# Patient Record
Sex: Female | Born: 1955 | Race: White | Hispanic: No | Marital: Married | Smoking: Never smoker
Health system: Southern US, Academic
[De-identification: ages and names within clinical notes are randomized; demographics above are authoritative.]

## PROBLEM LIST (undated history)

## (undated) DIAGNOSIS — K219 Gastro-esophageal reflux disease without esophagitis: Secondary | ICD-10-CM

## (undated) DIAGNOSIS — N200 Calculus of kidney: Secondary | ICD-10-CM

## (undated) DIAGNOSIS — E785 Hyperlipidemia, unspecified: Secondary | ICD-10-CM

## (undated) DIAGNOSIS — I10 Essential (primary) hypertension: Secondary | ICD-10-CM

## (undated) DIAGNOSIS — N3010 Interstitial cystitis (chronic) without hematuria: Secondary | ICD-10-CM

## (undated) HISTORY — PX: HX TONSILLECTOMY: SHX27

## (undated) HISTORY — PX: HX ADENOIDECTOMY: SHX29

## (undated) HISTORY — DX: Gastro-esophageal reflux disease without esophagitis: K21.9

## (undated) HISTORY — DX: Essential (primary) hypertension: I10

## (undated) HISTORY — PX: HX APPENDECTOMY: SHX54

## (undated) HISTORY — DX: Hyperlipidemia, unspecified: E78.5

## (undated) HISTORY — PX: HX GALL BLADDER SURGERY/CHOLE: SHX55

## (undated) HISTORY — PX: HX HYSTERECTOMY: SHX81

---

## 1987-07-28 ENCOUNTER — Other Ambulatory Visit (HOSPITAL_COMMUNITY): Payer: Self-pay | Admitting: OBSTETRICS/GYNECOLOGY

## 2019-06-14 IMAGING — MG 3D SCREENING MAMMO BIL W/CAD
4 series · 8 of 14 positions shown · non-contrast
Comparison: 11/01/2019 and 09/30/2018.

------------- REPORT [id number] -------------
[facility]
[address]
Daina Ms.DEP, PIERRE EDOUARD:
We wish to report the following on your recent mammography examination. We are sending a report to your referring physician or other health care provider. 
(       Normal/Negative:
No evidence of cancer.
This statement is mandated by the [place], Department of Health.
Your examination was performed by one of our technologists, who are registered radiological technologists and also specially certified in mammography:
___
Parlak, Edaly (M)
Nepomuceno, Martinez (M)

Your mammogram was interpreted by our radiologist.
( 
Sofeine Made, M.D.
(Annual Breast Examination by a physician or other health care provider
(Annual Mammography Screening beginning at age 40
(Monthly Breast Self Examination
------------- REPORT [id number] -------------
OSLAJ, MITCH MABLE
RAJCOMAR, SHIBCHURN
EXAM:  3D BILATERAL ANNUAL SCREENING DIGITAL MAMMOGRAM WITH CAD AND TOMOSYNTHESIS
INDICATION: Screening.

[Series 1943: R CC · right · 2 of 2 slices shown]
[im 1/2]
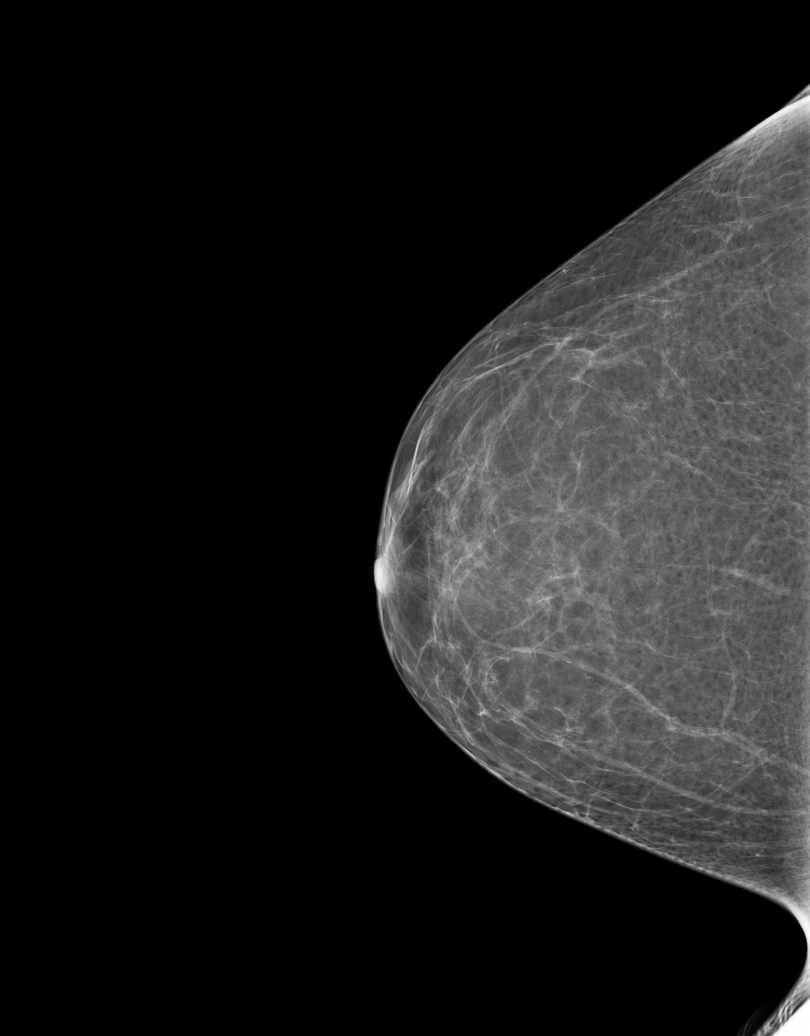
[im 2/2]
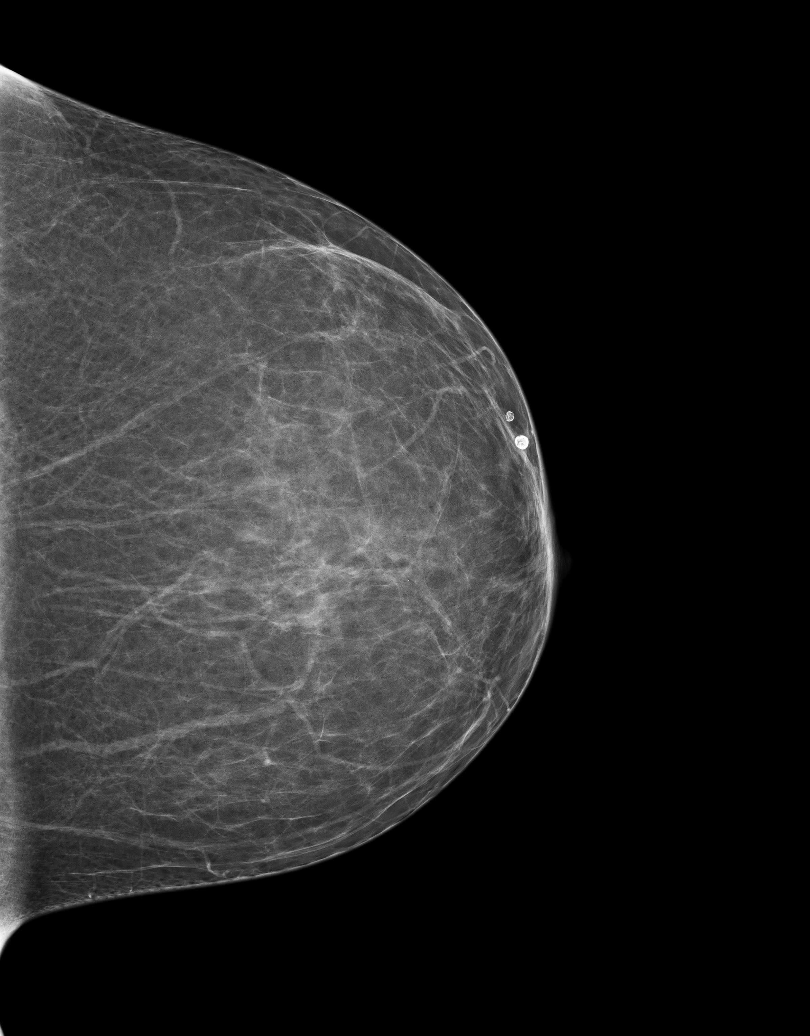

[Series 1945: 3D SCREENING MAMMO BIL W/CAD · 2 acquisitions, 4 frames shown]
[im 1/2]
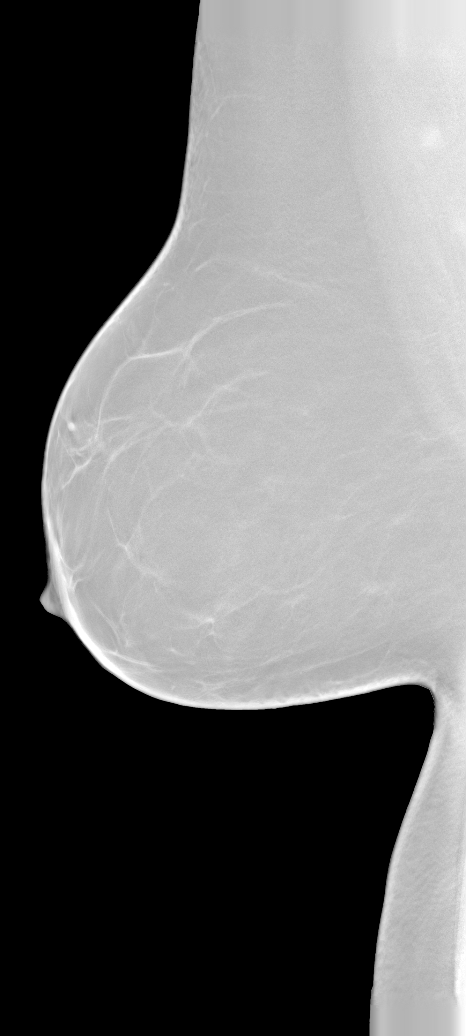
[im 1/2]
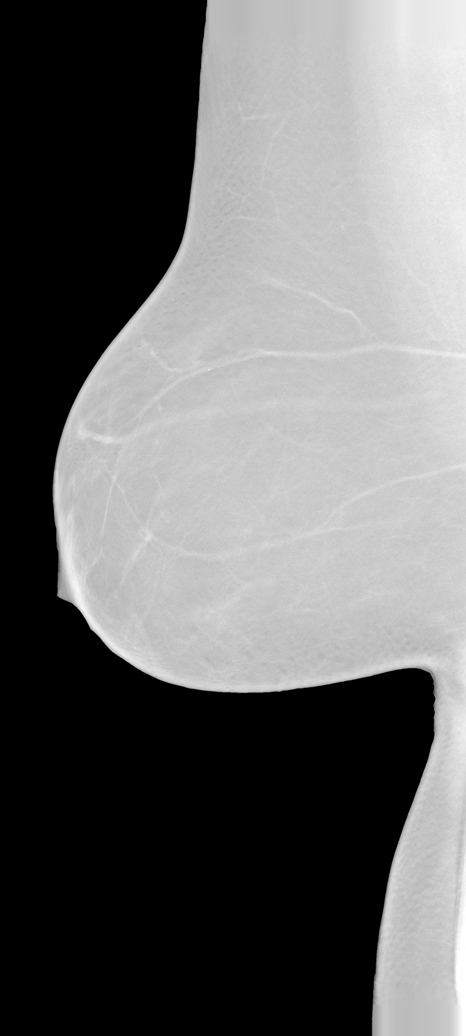
[im 2/2]
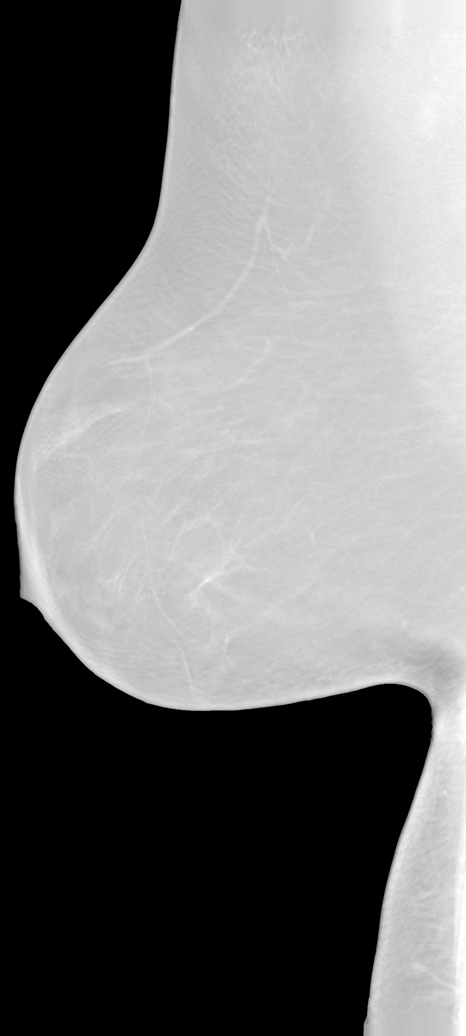
[im 2/2]
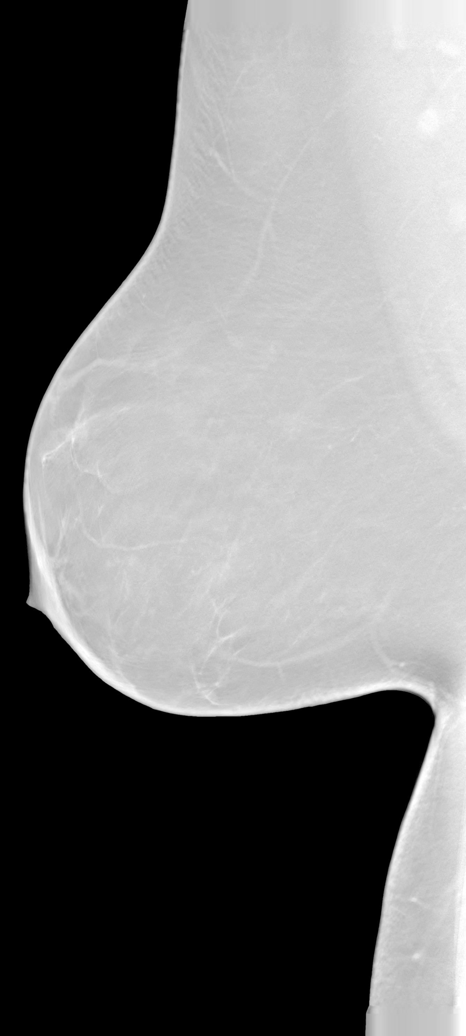

[R]
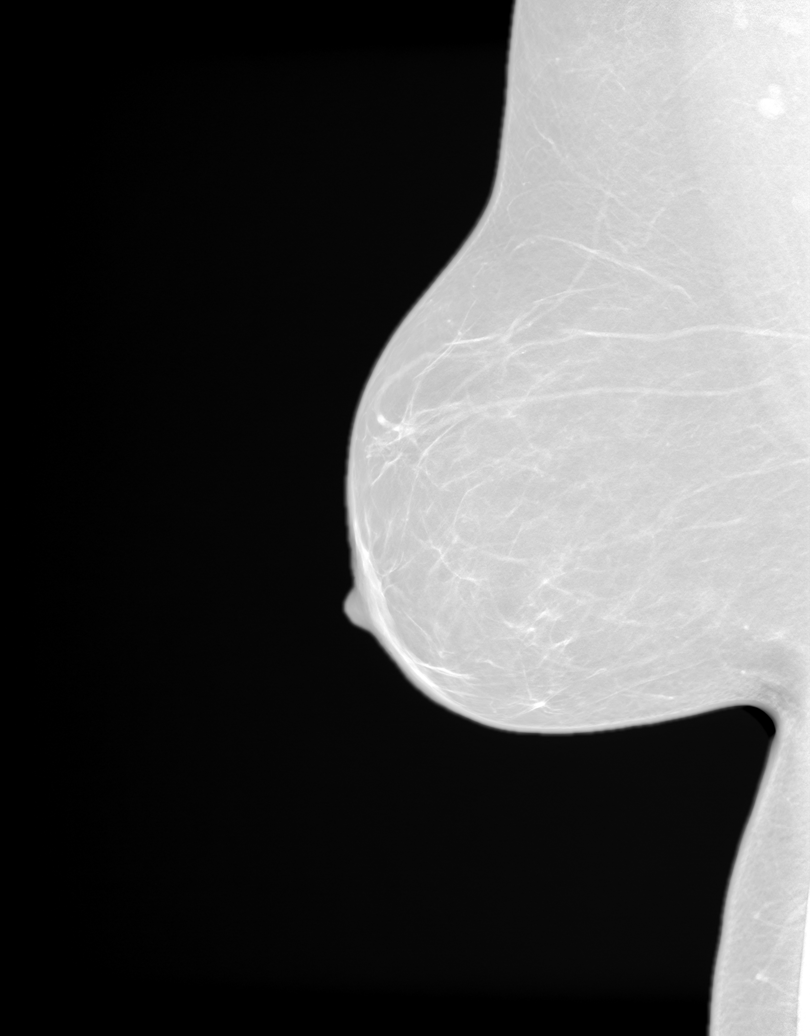

[L]
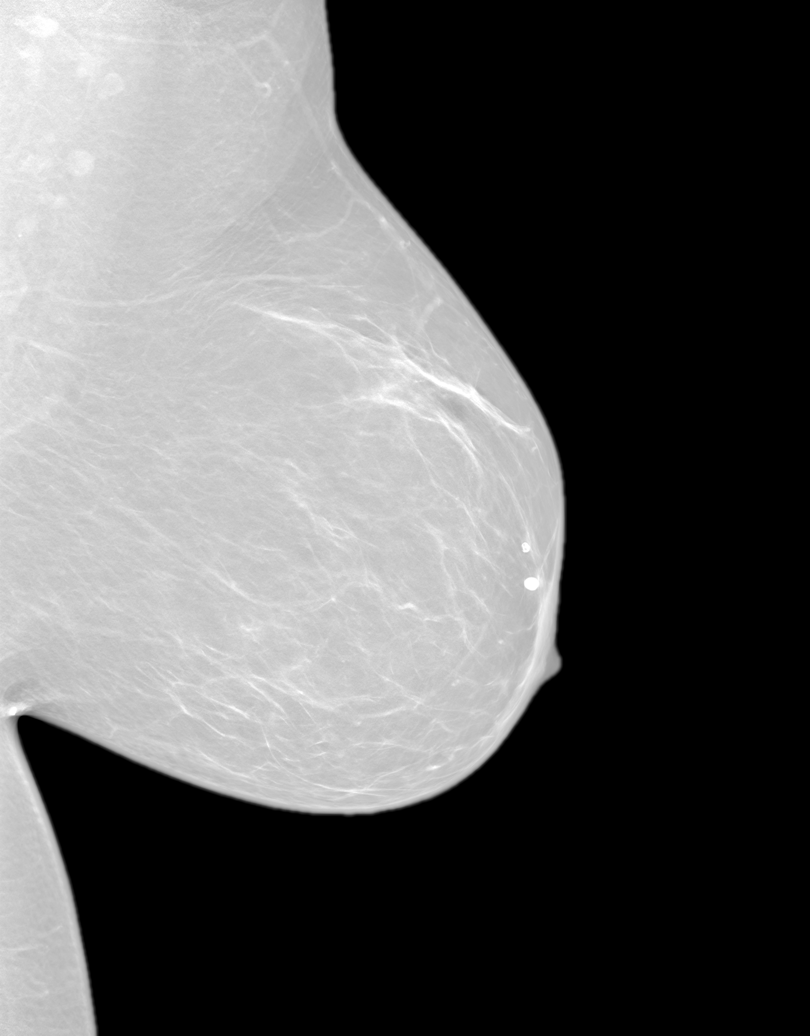

[8 of 14 positions shown; findings below may reference images not displayed]

FINDINGS: There are scattered fibroglandular elements.  There is no mass or suspicious cluster of microcalcifications.   There is no architectural distortion, skin thickening or nipple retraction.
IMPRESSION: 1.  BIRADS 2-Benign findings. Patient has been added in a reminder system with a target date for the next screening mammography.

2.  DENSITY CODE – B (Scattered areas of fibroglandular density.) 

Final Assessment Code:

Bi-Rads 2 

BI-RADS 0
Need additional imaging evaluation.

BI-RADS 1
Negative mammogram.

BI-RADS 2
Benign finding.

BI-RADS 3
Probably benign finding; short-interval follow-up suggested.

BI-RADS 4
Suspicious abnormality; biopsy should be considered.

BI-RADS 5
Highly suggestive of malignancy; appropriate action should be taken.

BI-RADS 6
Known biopsy-proven malignancy; appropriate action should be taken. 

NOTE:
In compliance with Federal regulations, the results of this mammogram are being sent to the patient.

## 2021-08-01 ENCOUNTER — Other Ambulatory Visit (HOSPITAL_COMMUNITY): Payer: Self-pay | Admitting: Family

## 2021-08-01 ENCOUNTER — Inpatient Hospital Stay
Admission: RE | Admit: 2021-08-01 | Discharge: 2021-08-01 | Disposition: A | Discharge status: Home / Self Care | Payer: 59 | Source: Ambulatory Visit | Attending: Family | Admitting: Family

## 2021-08-01 ENCOUNTER — Other Ambulatory Visit: Payer: Self-pay

## 2021-08-01 DIAGNOSIS — R059 Cough, unspecified: Secondary | ICD-10-CM | POA: Insufficient documentation

## 2021-08-16 ENCOUNTER — Other Ambulatory Visit: Payer: Self-pay

## 2021-11-16 ENCOUNTER — Other Ambulatory Visit: Payer: Self-pay

## 2021-11-16 IMAGING — CT CT BRAIN W/O CONTRAST
2 series · 16 of 30 positions shown, 20 images · non-contrast
Comparison: None available.

﻿EXAM:  [id number]   CT BRAIN W/O CONTRAST
INDICATION: Dizziness. Headache.
TECHNIQUE: CT head was performed without contrast and reviewed in multiple windows. Radiation dose 9811 mGy cm. Exam was performed using one or more of the following dose reduction techniques: Automated exposure control, adjustment of the mA and/or kV according to patient size, or the use of iterative reconstruction technique.

[Series 2: axial without · axial · non-contrast · 0.51mm/px · z∈[+131,+275]mm · 13 of 112 slices shown, 17 images]
[im 8/112  brain]
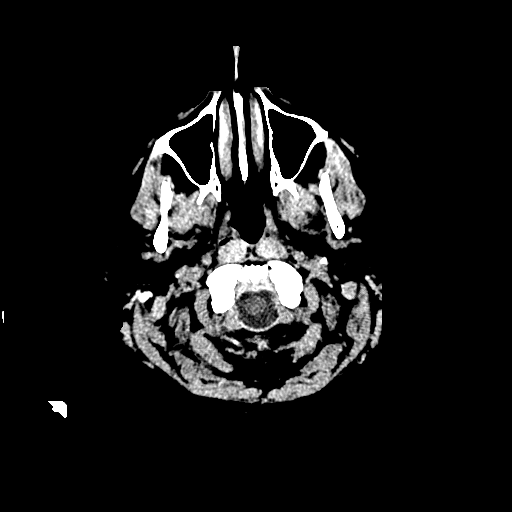
[im 8/112  bone]
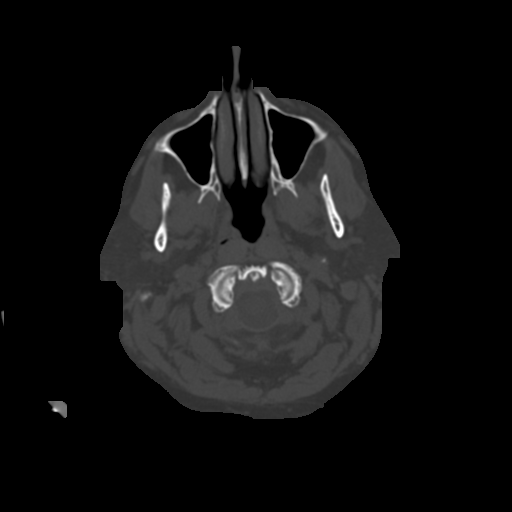
[im 16/112  brain]
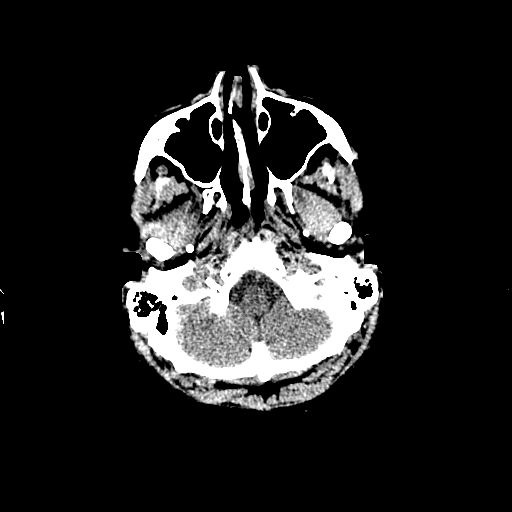
[im 24/112  brain]
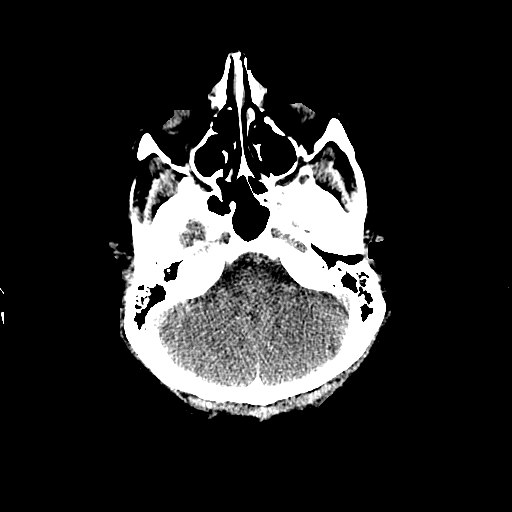
[im 32/112  brain]
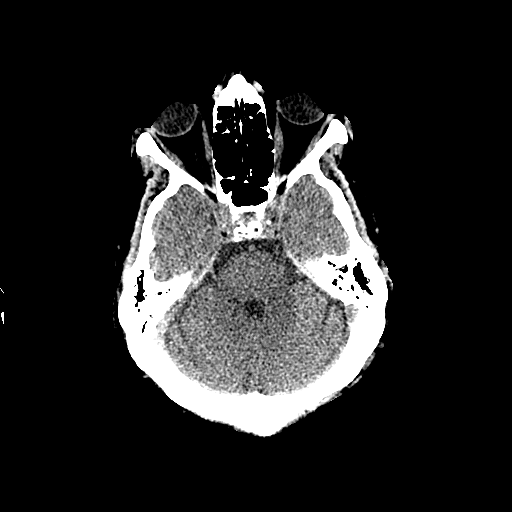
[im 40/112  brain]
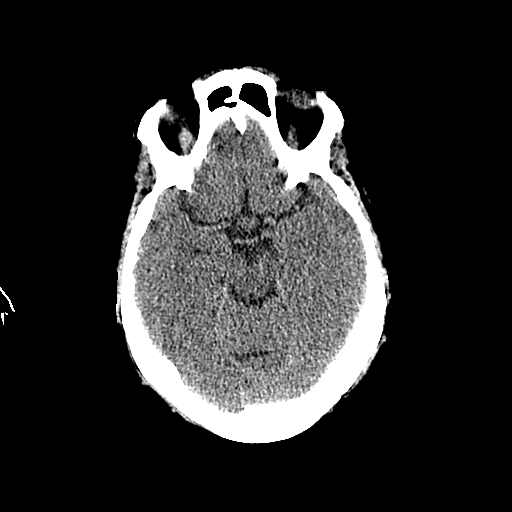
[im 40/112  bone]
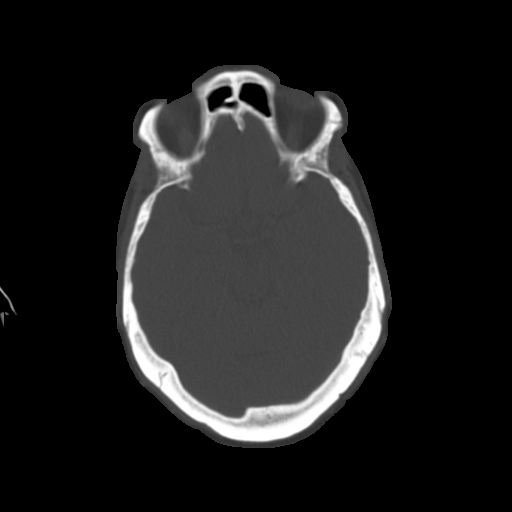
[im 48/112  brain]
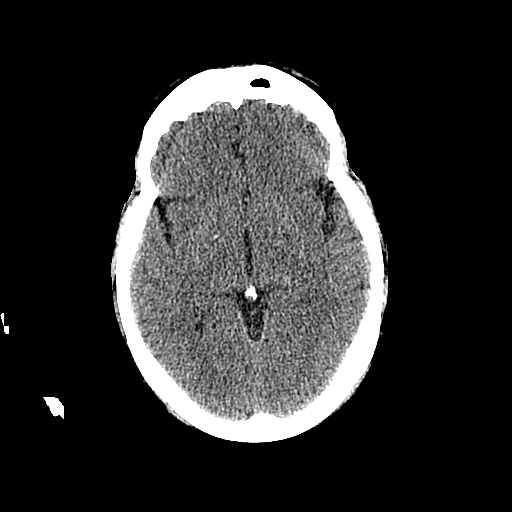
[im 56/112  brain]
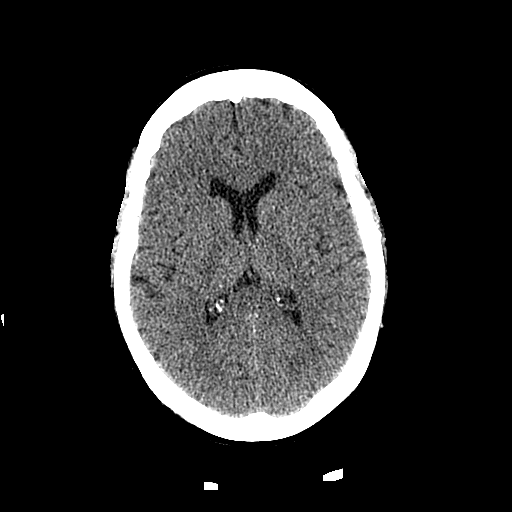
[im 64/112  brain]
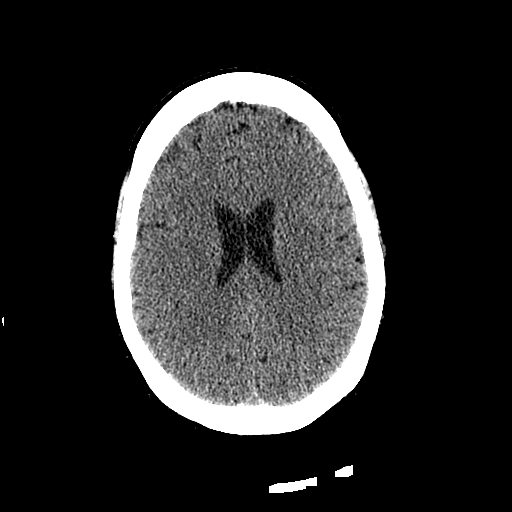
[im 72/112  brain]
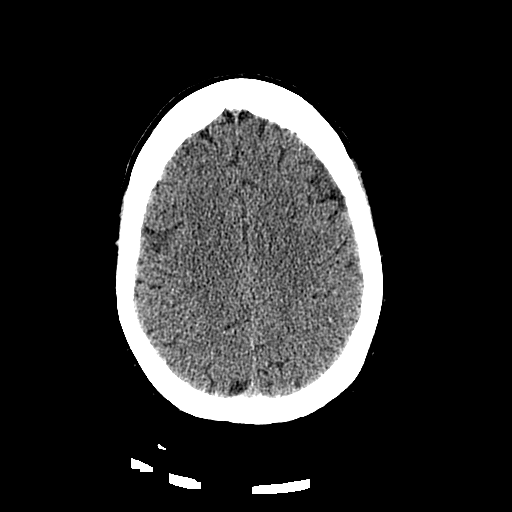
[im 72/112  bone]
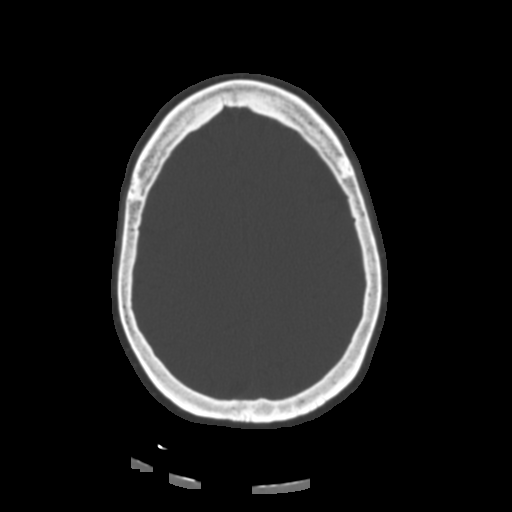
[im 80/112  brain]
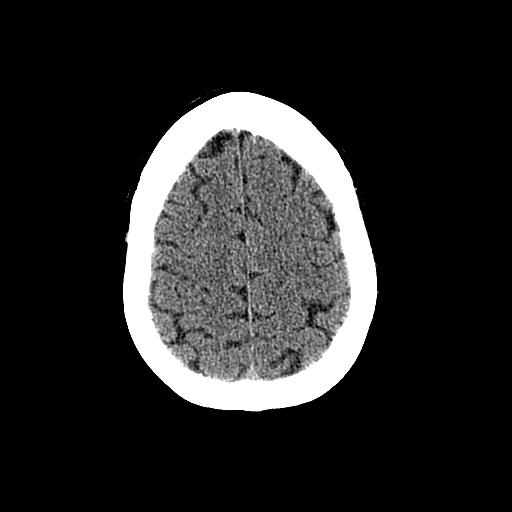
[im 88/112  brain]
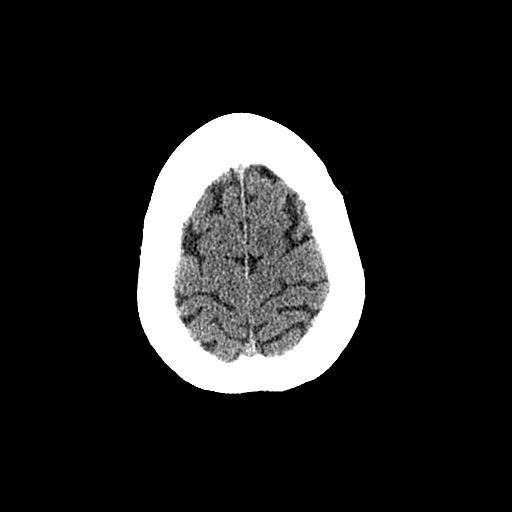
[im 96/112  brain]
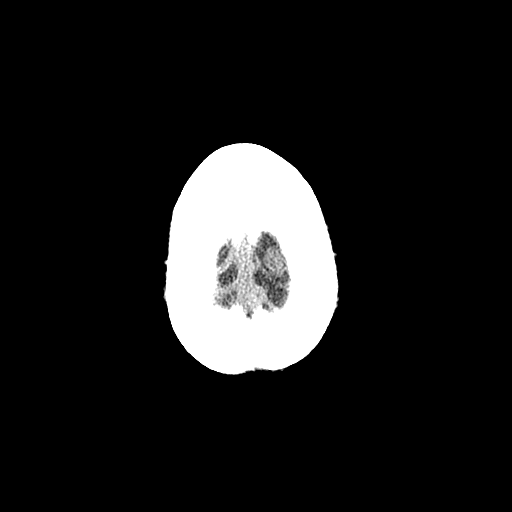
[im 104/112  brain]
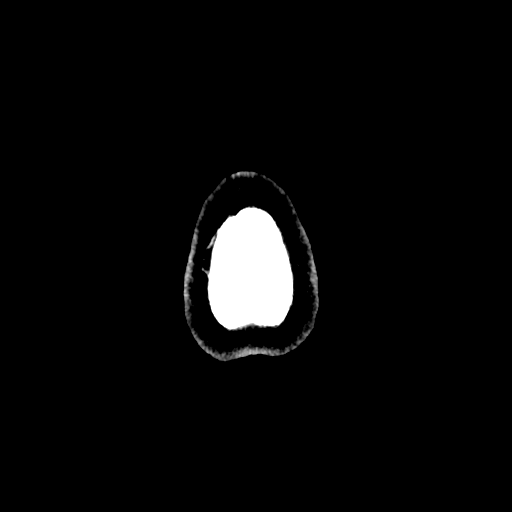
[im 104/112  bone]
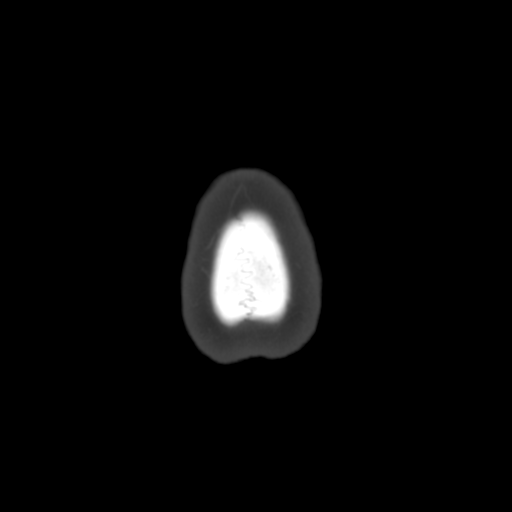

[Series 3: ax bone (hospital) · axial · 0.51mm/px · z∈[+131,+179]mm · 3 of 112 slices shown]
[im 8/112  bone]
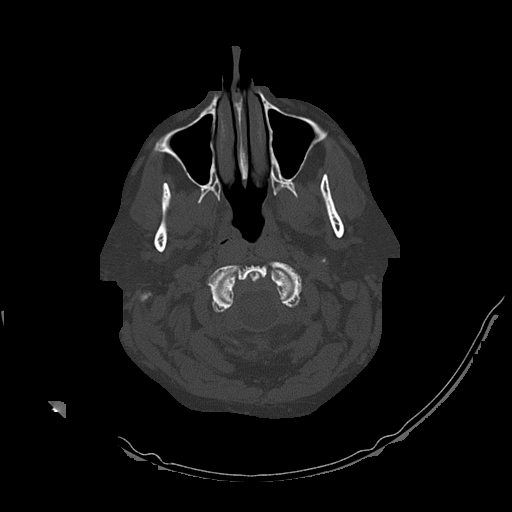
[im 24/112  bone]
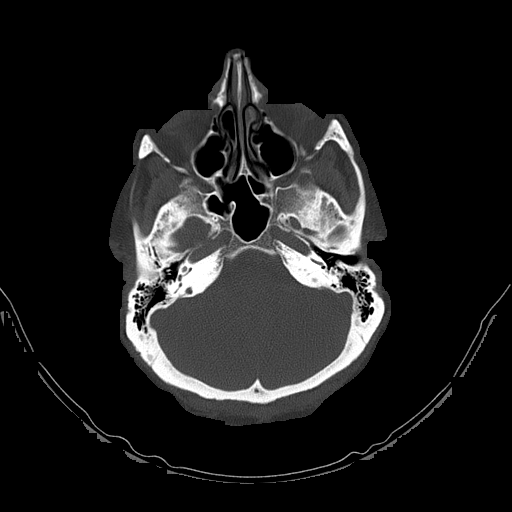
[im 40/112  bone]
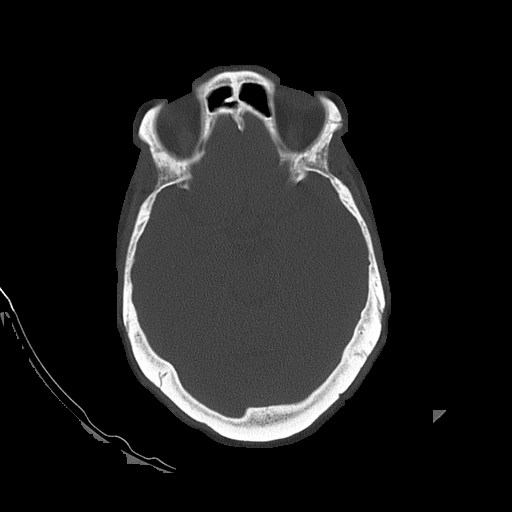

[16 of 30 positions shown; findings below may reference images not displayed]

FINDINGS: No intracranial bleed or extra-axial collections are seen.  No evidence of ventriculomegaly or midline shift is noted.  No abnormalities of the posterior fossa structures are seen in this noncontrast examination. 

Paranasal sinuses and mastoids do not show any acute findings.
IMPRESSION: Limited noncontrast CT head shows no focal or acute intracranial lesions.  Paranasal sinuses and mastoids are unremarkable.

## 2022-09-21 ENCOUNTER — Emergency Department (HOSPITAL_COMMUNITY): Payer: MEDICARE

## 2022-09-21 ENCOUNTER — Encounter (HOSPITAL_COMMUNITY): Payer: Self-pay

## 2022-09-21 ENCOUNTER — Other Ambulatory Visit: Payer: Self-pay

## 2022-09-21 ENCOUNTER — Emergency Department
Admission: EM | Admit: 2022-09-21 | Discharge: 2022-09-21 | Disposition: A | Discharge status: Home / Self Care | Payer: MEDICARE | Attending: Family | Admitting: Family

## 2022-09-21 DIAGNOSIS — K760 Fatty (change of) liver, not elsewhere classified: Secondary | ICD-10-CM | POA: Insufficient documentation

## 2022-09-21 DIAGNOSIS — N3011 Interstitial cystitis (chronic) with hematuria: Secondary | ICD-10-CM | POA: Insufficient documentation

## 2022-09-21 DIAGNOSIS — N200 Calculus of kidney: Secondary | ICD-10-CM | POA: Insufficient documentation

## 2022-09-21 LAB — URINALYSIS, MICROSCOPIC
RBCS: 10 /hpf — ABNORMAL HIGH (ref <4)
RENAL EPITHELIAL CELLS URINE: <1 /hpf (ref <6)
SQUAMOUS EPITHELIAL: <1 /hpf (ref <28)
WBCS: 5 /hpf (ref <6)

## 2022-09-21 LAB — COMPREHENSIVE METABOLIC PANEL, NON-FASTING
ALBUMIN/GLOBULIN RATIO: 1.4 (ref 0.8–1.4)
ALBUMIN: 4.3 g/dL (ref 3.5–5.7)
ALKALINE PHOSPHATASE: 53 U/L (ref 34–104)
ALT (SGPT): 39 U/L (ref 7–52)
ANION GAP: 10 mmol/L (ref 4–13)
AST (SGOT): 37 U/L (ref 13–39)
BILIRUBIN TOTAL: 0.4 mg/dL (ref 0.3–1.2)
BUN/CREA RATIO: 26 — ABNORMAL HIGH (ref 6–22)
BUN: 18 mg/dL (ref 7–25)
CALCIUM, CORRECTED: 9 mg/dL (ref 8.9–10.8)
CALCIUM: 9.2 mg/dL (ref 8.6–10.3)
CHLORIDE: 105 mmol/L (ref 98–107)
CO2 TOTAL: 25 mmol/L (ref 21–31)
CREATININE: 0.7 mg/dL (ref 0.60–1.30)
ESTIMATED GFR: 96 mL/min/{1.73_m2} (ref >59)
GLOBULIN: 3.1 (ref 2.9–5.4)
GLUCOSE: 128 mg/dL — ABNORMAL HIGH (ref 74–109)
OSMOLALITY, CALCULATED: 283 mOsm/kg (ref 270–290)
POTASSIUM: 3.4 mmol/L — ABNORMAL LOW (ref 3.5–5.1)
PROTEIN TOTAL: 7.4 g/dL (ref 6.4–8.9)
SODIUM: 140 mmol/L (ref 136–145)

## 2022-09-21 LAB — BLUE TOP TUBE

## 2022-09-21 LAB — CBC WITH DIFF
BASOPHIL #: 0.1 10*3/uL (ref 0.00–0.10)
BASOPHIL %: 1 % (ref 0–1)
EOSINOPHIL #: 0.4 10*3/uL (ref 0.00–0.50)
EOSINOPHIL %: 4 %
HCT: 41.2 % (ref 31.2–41.9)
HGB: 14 g/dL (ref 10.9–14.3)
LYMPHOCYTE #: 3.6 10*3/uL — ABNORMAL HIGH (ref 1.00–3.00)
LYMPHOCYTE %: 40 % (ref 16–44)
MCH: 30.5 pg (ref 24.7–32.8)
MCHC: 33.9 g/dL (ref 32.3–35.6)
MCV: 89.9 fL (ref 75.5–95.3)
MONOCYTE #: 0.8 10*3/uL (ref 0.30–1.00)
MONOCYTE %: 9 % (ref 5–13)
MPV: 8.8 fL (ref 7.9–10.8)
NEUTROPHIL #: 4.2 10*3/uL (ref 1.85–7.80)
NEUTROPHIL %: 46 % (ref 43–77)
PLATELETS: 273 10*3/uL (ref 140–440)
RBC: 4.58 10*6/uL (ref 3.63–4.92)
RDW: 13.5 % (ref 12.3–17.7)
WBC: 9 10*3/uL (ref 3.8–11.8)

## 2022-09-21 LAB — LIPASE: LIPASE: 27 U/L (ref 11–82)

## 2022-09-21 LAB — URINALYSIS, MACROSCOPIC
BILIRUBIN: NEGATIVE mg/dL
BLOOD: 0.06 mg/dL — AB
GLUCOSE: NEGATIVE mg/dL
KETONES: NEGATIVE mg/dL
LEUKOCYTES: NEGATIVE WBCs/uL
NITRITE: NEGATIVE
PH: 5.5 (ref 5.0–9.0)
PROTEIN: 20 mg/dL
SPECIFIC GRAVITY: 1.028 (ref 1.002–1.030)
UROBILINOGEN: NORMAL mg/dL

## 2022-09-21 LAB — GOLD TOP TUBE

## 2022-09-21 LAB — LACTIC ACID LEVEL W/ REFLEX FOR LEVEL >2.0: LACTIC ACID: 2 mmol/L (ref 0.5–2.2)

## 2022-09-21 MED ORDER — MORPHINE 4 MG/ML INJECTION WRAPPER
INJECTION | INTRAMUSCULAR | Status: AC
Start: 2022-09-21 — End: 2022-09-21
  Filled 2022-09-21: qty 1

## 2022-09-21 MED ORDER — HYDROCODONE 5 MG-ACETAMINOPHEN 325 MG TABLET
1.0000 | ORAL_TABLET | Freq: Four times a day (QID) | ORAL | 0 refills | Status: DC | PRN
Start: 2022-09-21 — End: 2022-12-21

## 2022-09-21 MED ORDER — ONDANSETRON HCL (PF) 4 MG/2 ML INJECTION SOLUTION
INTRAMUSCULAR | Status: AC
Start: 2022-09-21 — End: 2022-09-21
  Filled 2022-09-21: qty 2

## 2022-09-21 MED ORDER — SODIUM CHLORIDE 0.9 % IV BOLUS
1000.0000 mL | INJECTION | Status: AC
Start: 2022-09-21 — End: 2022-09-21
  Administered 2022-09-21: 1000 mL via INTRAVENOUS
  Administered 2022-09-21: 0 mL via INTRAVENOUS

## 2022-09-21 MED ORDER — TAMSULOSIN 0.4 MG CAPSULE
0.4000 mg | ORAL_CAPSULE | Freq: Every evening | ORAL | 0 refills | Status: DC
Start: 2022-09-21 — End: 2022-12-21

## 2022-09-21 MED ORDER — MORPHINE 4 MG/ML INJECTION WRAPPER
4.0000 mg | INJECTION | INTRAMUSCULAR | Status: AC
Start: 2022-09-21 — End: 2022-09-21
  Administered 2022-09-21: 4 mg via INTRAVENOUS

## 2022-09-21 MED ORDER — ONDANSETRON HCL (PF) 4 MG/2 ML INJECTION SOLUTION
4.0000 mg | INTRAMUSCULAR | Status: AC
Start: 2022-09-21 — End: 2022-09-21
  Administered 2022-09-21: 4 mg via INTRAVENOUS

## 2022-09-21 NOTE — ED Nurses Note (Signed)
Patient D/C home at this time. Instructions reviewed and understanding verbalized. Patient left department via ambulation. Patient being taken home by son

## 2022-09-21 NOTE — ED Triage Notes (Signed)
Left lower back pain into left abdomen, first occurred on Thursday and went away with ibuprofen came back today worse and is having n/v d/t pain.

## 2022-09-21 NOTE — ED Provider Notes (Signed)
[facility]  ED Primary Provider Note  History of Present Illness   Chief Complaint   Patient presents with    Flank Pain     Selena Duke is a 67 y.o. female who had concerns including Flank Pain.  Arrival: The patient arrived by Car    Patient 67 year old female to the emergency department complaining of left CVA tenderness that radiates into her groin.  Patient states onset was on Thursday and has been intermittent since.  Patient states she has been taking Motrin with mild relief of pain.  Patient states it is a sharp pain that radiates from her left flank into her left groin.  Patient denies dysuria, hematuria or anuria.  Patient states she does have history of interstitial cystitis.  Patient denies diarrhea or constipation.  Patient has had 1 episode of vomiting due to pain.  Patient is currently rating her pain an 8/10 that has not exacerbated or relieved by anything currently.      History Reviewed This Encounter:     Physical Exam   ED Triage Vitals [09/21/22 1606]   BP (Non-Invasive) (!) 170/100   Heart Rate 80   Respiratory Rate 18   Temperature 37.1 C (98.7 F)   SpO2 98 %   Weight 99.8 kg (220 lb)   Height 1.549 m (5\' 1" )     Physical Exam  Vitals and nursing note reviewed.   Constitutional:       General: She is not in acute distress.     Appearance: She is well-developed.   HENT:      Head: Normocephalic and atraumatic.   Eyes:      Conjunctiva/sclera: Conjunctivae normal.   Cardiovascular:      Rate and Rhythm: Normal rate and regular rhythm.      Heart sounds: No murmur heard.  Pulmonary:      Effort: Pulmonary effort is normal. No respiratory distress.      Breath sounds: Normal breath sounds.   Abdominal:      Palpations: Abdomen is soft.      Tenderness: There is no abdominal tenderness. There is left CVA tenderness.   Musculoskeletal:         General: No swelling.      Cervical back: Neck supple.   Skin:     General: Skin is warm and dry.      Capillary  Refill: Capillary refill takes less than 2 seconds.   Neurological:      Mental Status: She is alert.   Psychiatric:         Mood and Affect: Mood normal.       Patient Data     Labs Ordered/Reviewed   COMPREHENSIVE METABOLIC PANEL, NON-FASTING - Abnormal; Notable for the following components:       Result Value    POTASSIUM 3.4 (*)     BUN/CREA RATIO 26 (*)     GLUCOSE 128 (*)     All other components within normal limits    Narrative:     Estimated Glomerular Filtration Rate (eGFR) is calculated using the CKD-EPI (2021) equation, intended for patients 29 years of age and older. If gender is not documented or "unknown", there will be no eGFR calculation.     CBC WITH DIFF - Abnormal; Notable for the following components:    LYMPHOCYTE # 3.60 (*)     All other components within normal limits   URINALYSIS, MACROSCOPIC - Abnormal; Notable for the following  components:    BLOOD 0.06 (*)     All other components within normal limits   URINALYSIS, MICROSCOPIC - Abnormal; Notable for the following components:    MUCOUS Few (*)     RBCS 10 (*)     WHITE BLOOD CELL CLUMP Rare (*)     All other components within normal limits   LACTIC ACID LEVEL W/ REFLEX FOR LEVEL >2.0 - Normal   URINE CULTURE,ROUTINE   CBC/DIFF    Narrative:     The following orders were created for panel order CBC/DIFF.  Procedure                               Abnormality         Status                     ---------                               -----------         ------                     CBC WITH QMVH[[id number]]                Abnormal            Final result                 Please view results for these tests on the individual orders.   URINALYSIS, MACROSCOPIC AND MICROSCOPIC W/CULTURE REFLEX    Narrative:     The following orders were created for panel order URINALYSIS, MACROSCOPIC AND MICROSCOPIC W/CULTURE REFLEX [PRN ONLY].  Procedure                               Abnormality         Status                     ---------                                -----------         ------                     URINALYSIS, MACROSCOPIC[[id number]]      Abnormal            Final result               URINALYSIS, MICROSCOPIC[[id number]]      Abnormal            Final result                 Please view results for these tests on the individual orders.   LIPASE   EXTRA TUBES    Narrative:     The following orders were created for panel order EXTRA TUBES.  Procedure                               Abnormality         Status                     ---------                               -----------         ------  BLUE TOP HQIO[[id number]]                                    In process                 GOLD TOP LKGM[[id number]]                                    In process                   Please view results for these tests on the individual orders.   BLUE TOP TUBE   GOLD TOP TUBE     CT ABDOMEN PELVIS WO IV CONTRAST   Final Result by Edi, [facility] In (07/20 1708)   Left pelviectasis with a stone at the left UVJ measuring 0.3 cm      Fatty liver          One or more dose reduction techniques were used (e.g., Automated exposure control, adjustment of the mA and/or kV according to patient size, use of iterative reconstruction technique).         Radiologist location ID: [id number]           Medical Decision Making      Medical Decision Making  Patient 67 year old female to the emergency department complaining of left CVA tenderness that radiates into her groin.  Patient states onset was on Thursday and has been intermittent since.  Patient states she has been taking Motrin with mild relief of pain.  Patient states it is a sharp pain that radiates from her left flank into her left groin.  Patient denies dysuria, hematuria or anuria.  Patient states she does have history of interstitial cystitis.  Patient denies diarrhea or constipation.  Patient has had 1 episode of vomiting due to pain.  Patient is currently rating her pain an 8/10 that has not exacerbated or relieved by  anything currently.    Differential diagnosis include but are not limited to nephrolithiasis, hydronephrosis, pyelonephritis, cystitis, diverticulitis.  On physical examination CVA tenderness noted in left flank.  Patient is afebrile and vital signs are stable.  Urinalysis shows blood in the urine.  CT abdomen and pelvis shows 3 mm stone at the UV junction with no hydronephrosis or obstruction.  Kidney function within normal limits.  Patient was medicated with morphine with moderate relief in emergency department.  Patient was discharged on Flomax, Norco and to follow-up with primary care provider or Urology and return to ED if worsening of symptoms, decreased urine output or further concerns.    Amount and/or Complexity of Data Reviewed  Labs: ordered.  Radiology: ordered.    Risk  Prescription drug management.  Parenteral controlled substances.                Medications Administered in the ED   NS bolus infusion 1,000 mL (1,000 mL Intravenous New Bag/New Syringe 09/21/22 1715)   morphine 4 mg/mL injection (4 mg Intravenous Given 09/21/22 1717)   ondansetron (ZOFRAN) 2 mg/mL injection (4 mg Intravenous Given 09/21/22 1717)     Clinical Impression   Nephrolithiasis (Primary)       Disposition: Discharged

## 2022-09-23 LAB — URINE CULTURE,ROUTINE: URINE CULTURE: NO GROWTH

## 2022-11-28 IMAGING — MG 3D SCREENING MAMMO BIL AND TOMO
5 series · 8 of 24 positions shown · non-contrast
Comparison: 02/08/2021, 05/08/2019.

------------- REPORT [id number] -------------
[facility]
[address]
[place]/MS/MACOMBER, NOHEMY
We wish to report the following on your recent mammography examination. We are sending a report to your referring physician or other health care provider.
INDICATION: Screening mammogram.  Asymptomatic 67-year-old with no family history.  Lifetime breast cancer risk 5.7%.

[L]
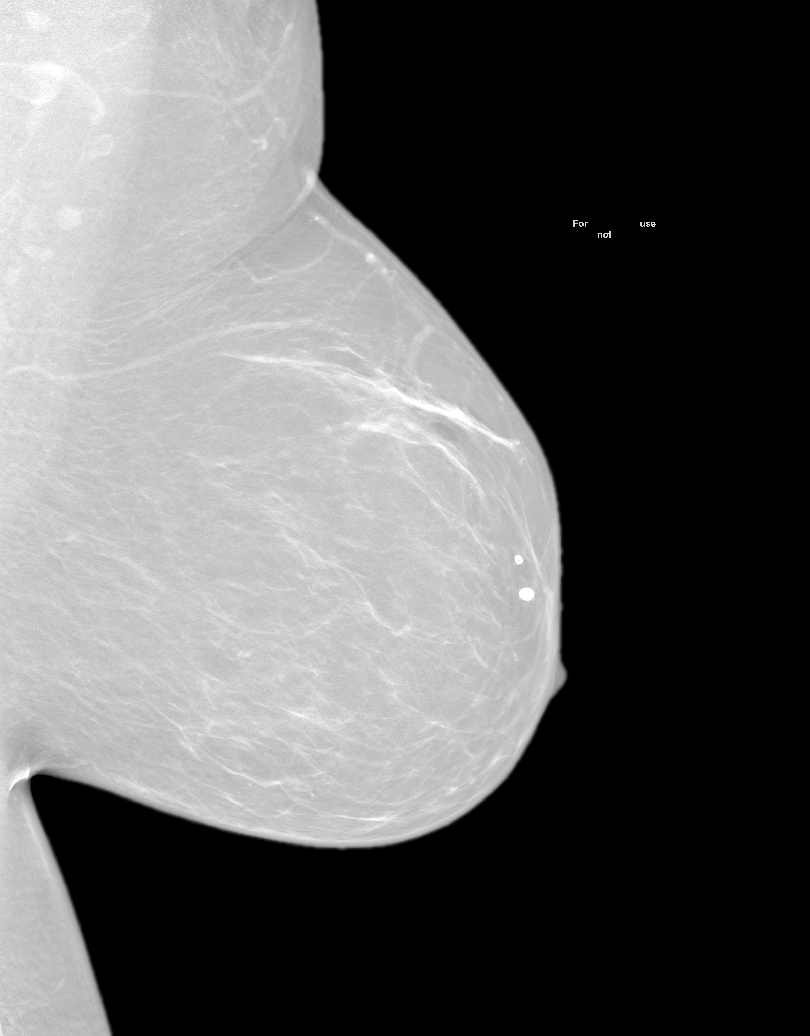

[R]
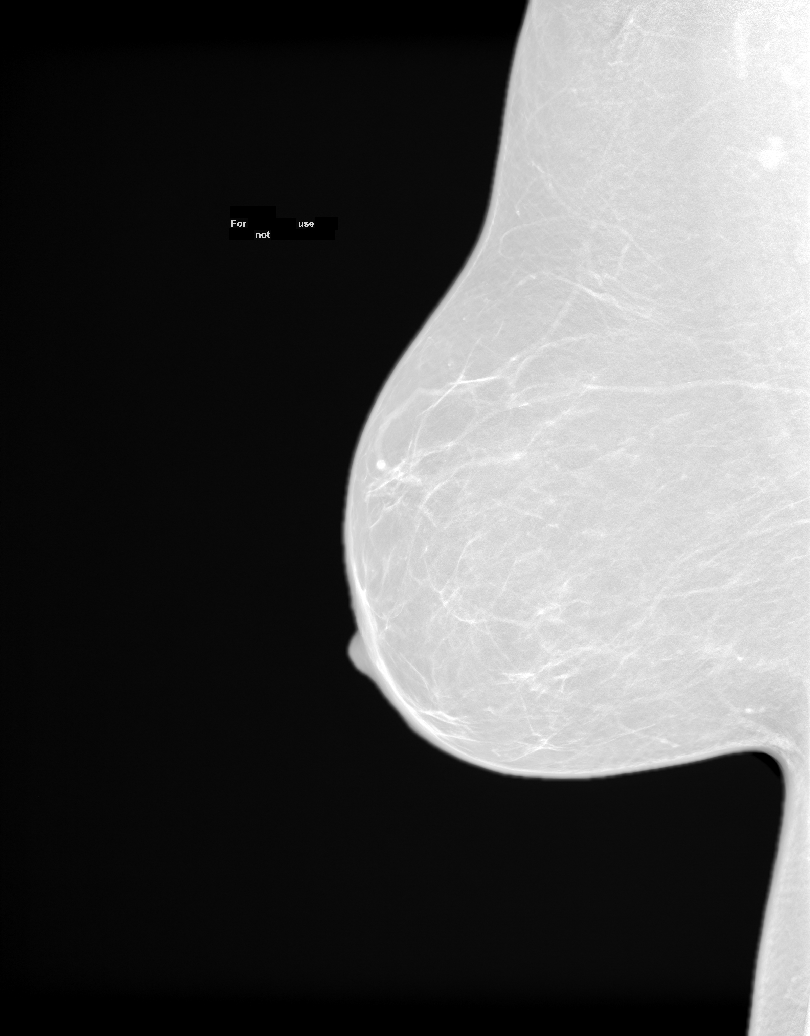

[R CC tomo · right · 0.10mm/px · 2 of 4 slices shown]
[im 1/4]
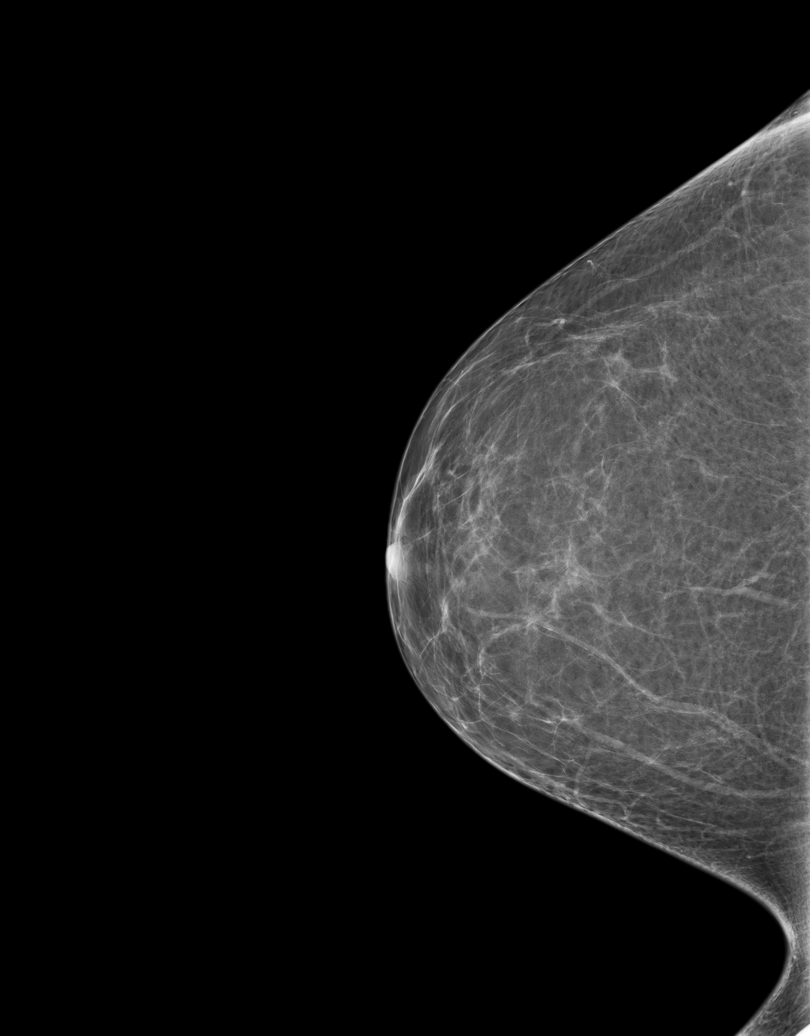
[im 4/4]
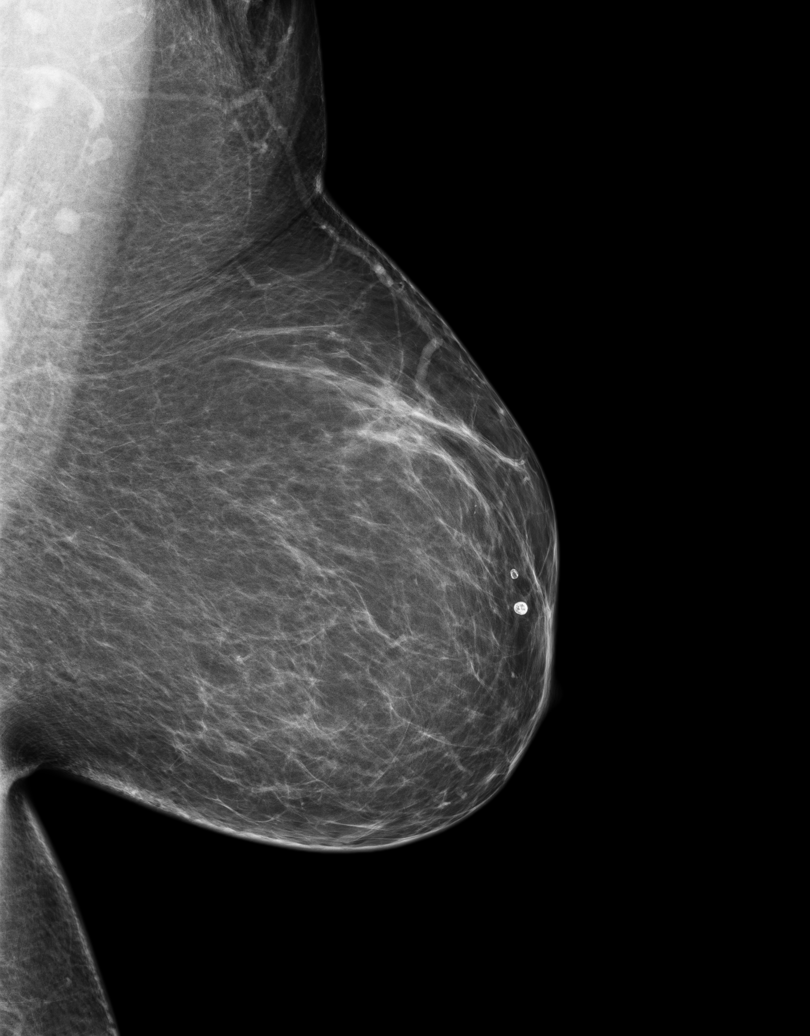

[3D SCREENING MAMMO BIL AND TOMO tomo · 2 acquisitions, 3 frames shown (1 of 2)]
[im 1/2]
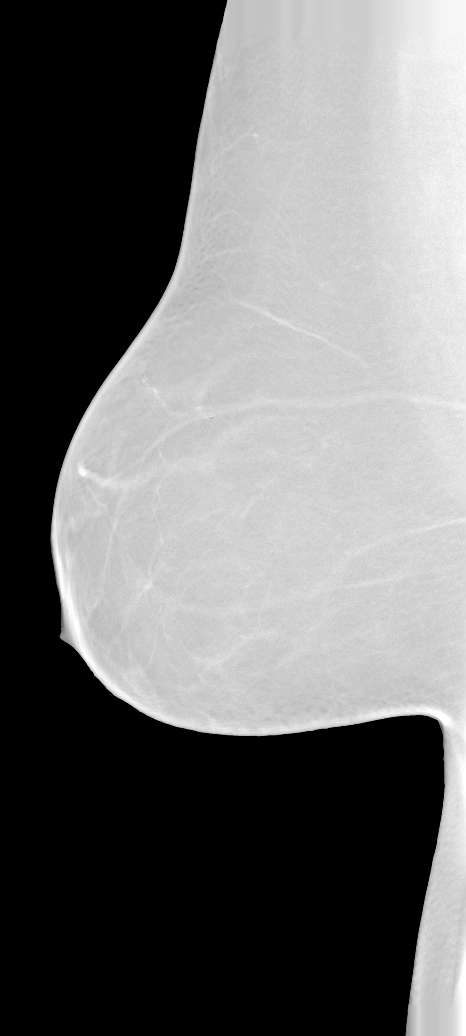
[im 2/2]
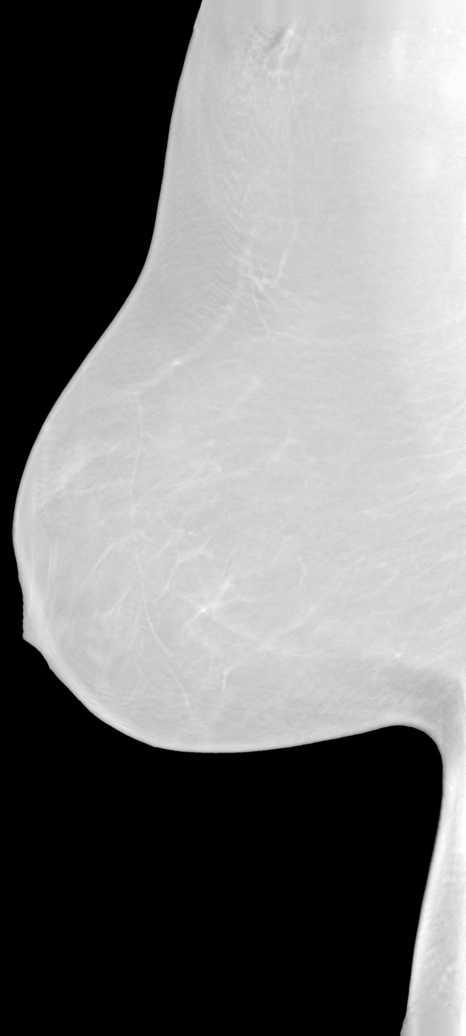
[im 2/2]
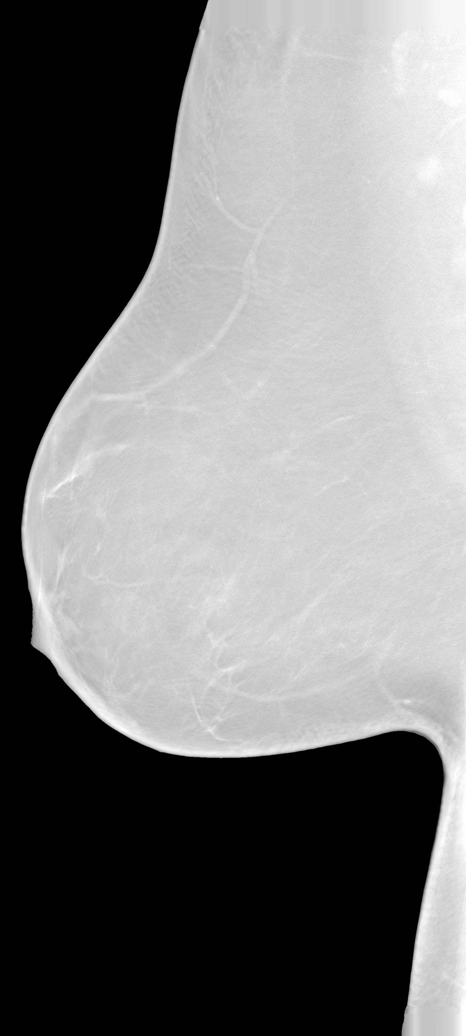

[3D SCREENING MAMMO BIL AND TOMO tomo (2 of 2) · tomo slice 14/88.0]
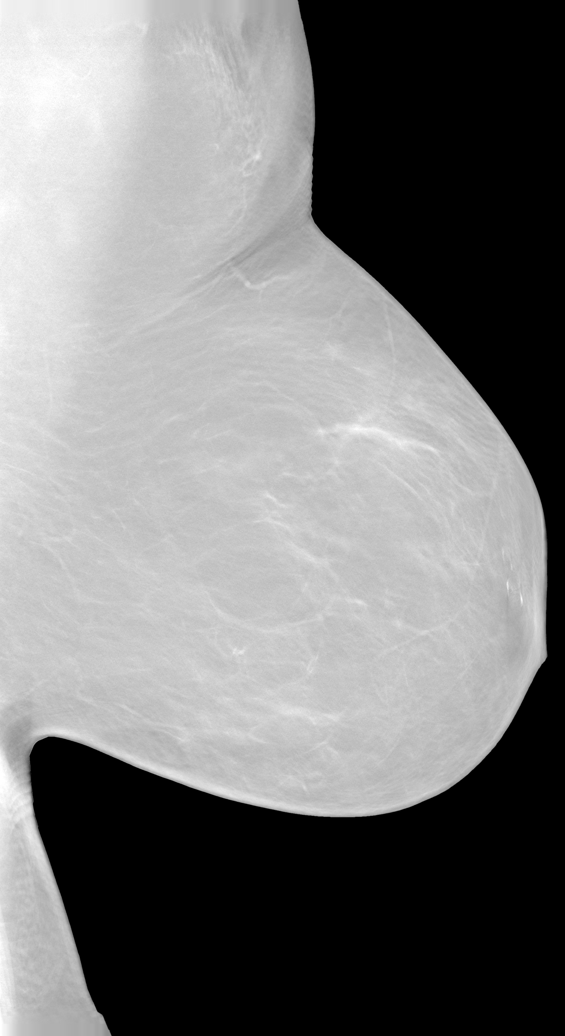

[8 of 24 positions shown; findings below may reference images not displayed]

FINDING: Normal-no evidence of cancer

This statement is mandated by the [place], Department of Health.
Your examination was performed by one of our technologists, who are registered radiological technologists and also specially certified in mammography:
___
Marzan, Heron (M)

Your mammogram was interpreted by our radiologist.

( 
Apple Martha, M.D.

(Annual Breast Examination by a physician or other health care provider
(Annual Mammography Screening beginning at age 40
(Monthly Breast Self Examination

------------- REPORT [id number] -------------
﻿

EXAM:  3D SCREENING MAMMO BIL AND TOMO
FINDINGS: There are scattered fibroglandular elements.  There is no mass or suspicious cluster of microcalcifications.   There is no architectural distortion, skin thickening or nipple retraction.
IMPRESSION: 1.  BIRADS 2-Benign findings. Patient has been added in a reminder system with a target date for the next screening mammography.

2.  DENSITY CODE –  B (Scattered areas of fibroglandular density) 

Final Assessment Code:

BI-RADS 0
 Need additional imaging evaluation.

BI-RADS 1
 Negative mammogram.

BI-RADS 2
 Benign finding.

BI-RADS 3
 Probably benign finding; short-interval follow-up suggested.

BI-RADS 4
 Suspicious abnormality; biopsy should be considered.

BI-RADS 5
 Highly suggestive of malignancy; appropriate action should be taken.

BI-RADS 6
 Known biopsy-proven malignancy; appropriate action should be taken.

NOTE:
In compliance with Federal regulations, the results of this mammogram are being sent to the patient.

## 2022-12-21 ENCOUNTER — Emergency Department
Admission: EM | Admit: 2022-12-21 | Discharge: 2022-12-21 | Disposition: A | Discharge status: Home / Self Care | Payer: MEDICARE

## 2022-12-21 ENCOUNTER — Emergency Department (HOSPITAL_COMMUNITY): Payer: MEDICARE

## 2022-12-21 ENCOUNTER — Other Ambulatory Visit: Payer: Self-pay

## 2022-12-21 ENCOUNTER — Encounter (HOSPITAL_COMMUNITY): Payer: Self-pay

## 2022-12-21 DIAGNOSIS — N200 Calculus of kidney: Secondary | ICD-10-CM

## 2022-12-21 DIAGNOSIS — N132 Hydronephrosis with renal and ureteral calculous obstruction: Secondary | ICD-10-CM | POA: Insufficient documentation

## 2022-12-21 DIAGNOSIS — N3010 Interstitial cystitis (chronic) without hematuria: Secondary | ICD-10-CM | POA: Insufficient documentation

## 2022-12-21 DIAGNOSIS — Z87442 Personal history of urinary calculi: Secondary | ICD-10-CM | POA: Insufficient documentation

## 2022-12-21 DIAGNOSIS — N1330 Unspecified hydronephrosis: Secondary | ICD-10-CM

## 2022-12-21 DIAGNOSIS — R110 Nausea: Secondary | ICD-10-CM

## 2022-12-21 DIAGNOSIS — K760 Fatty (change of) liver, not elsewhere classified: Secondary | ICD-10-CM | POA: Insufficient documentation

## 2022-12-21 HISTORY — DX: Interstitial cystitis (chronic) without hematuria: N30.10

## 2022-12-21 HISTORY — DX: Calculus of kidney: N20.0

## 2022-12-21 LAB — COMPREHENSIVE METABOLIC PANEL, NON-FASTING
ALBUMIN/GLOBULIN RATIO: 1.3 (ref 0.8–1.4)
ALBUMIN: 4.6 g/dL (ref 3.5–5.7)
ALKALINE PHOSPHATASE: 52 U/L (ref 34–104)
ALT (SGPT): 40 U/L (ref 7–52)
ANION GAP: 10 mmol/L (ref 4–13)
AST (SGOT): 37 U/L (ref 13–39)
BILIRUBIN TOTAL: 0.5 mg/dL (ref 0.3–1.0)
BUN/CREA RATIO: 25 — ABNORMAL HIGH (ref 6–22)
BUN: 19 mg/dL (ref 7–25)
CALCIUM, CORRECTED: 8.9 mg/dL (ref 8.9–10.8)
CALCIUM: 9.4 mg/dL (ref 8.6–10.3)
CHLORIDE: 103 mmol/L (ref 98–107)
CO2 TOTAL: 25 mmol/L (ref 21–31)
CREATININE: 0.77 mg/dL (ref 0.60–1.30)
ESTIMATED GFR: 85 mL/min/{1.73_m2} (ref >59)
GLOBULIN: 3.5 (ref 2.9–5.4)
GLUCOSE: 110 mg/dL — ABNORMAL HIGH (ref 74–109)
OSMOLALITY, CALCULATED: 279 mosm/kg (ref 270–290)
POTASSIUM: 4 mmol/L (ref 3.5–5.1)
PROTEIN TOTAL: 8.1 g/dL (ref 6.4–8.9)
SODIUM: 138 mmol/L (ref 136–145)

## 2022-12-21 LAB — CBC WITH DIFF
BASOPHIL #: 0.1 10*3/uL (ref 0.00–0.10)
BASOPHIL %: 1 % (ref 0–1)
EOSINOPHIL #: 0.1 10*3/uL (ref 0.00–0.50)
EOSINOPHIL %: 1 % (ref 1–7)
HCT: 43.5 % — ABNORMAL HIGH (ref 31.2–41.9)
HGB: 14.6 g/dL — ABNORMAL HIGH (ref 10.9–14.3)
LYMPHOCYTE #: 2.3 10*3/uL (ref 1.00–3.00)
LYMPHOCYTE %: 22 % (ref 16–44)
MCH: 30.1 pg (ref 24.7–32.8)
MCHC: 33.6 g/dL (ref 32.3–35.6)
MCV: 89.6 fL (ref 75.5–95.3)
MONOCYTE #: 0.7 10*3/uL (ref 0.30–1.00)
MONOCYTE %: 7 % (ref 5–13)
MPV: 8.7 fL (ref 7.9–10.8)
NEUTROPHIL #: 7.2 10*3/uL (ref 1.85–7.80)
NEUTROPHIL %: 69 % (ref 43–77)
PLATELETS: 255 10*3/uL (ref 140–440)
RBC: 4.86 10*6/uL (ref 3.63–4.92)
RDW: 13.4 % (ref 12.3–17.7)
WBC: 10.5 10*3/uL (ref 3.8–11.8)

## 2022-12-21 LAB — URINALYSIS, MACROSCOPIC
BILIRUBIN: NEGATIVE mg/dL
BLOOD: 0.06 mg/dL — AB
GLUCOSE: >1000 mg/dL — AB
KETONES: NEGATIVE mg/dL
LEUKOCYTES: NEGATIVE WBCs/uL
NITRITE: NEGATIVE
PH: 7.5 (ref 5.0–9.0)
PROTEIN: NEGATIVE mg/dL
SPECIFIC GRAVITY: 1.016 (ref 1.002–1.030)
UROBILINOGEN: NORMAL mg/dL

## 2022-12-21 LAB — URINALYSIS, MICROSCOPIC
RBCS: 24 /[HPF] — ABNORMAL HIGH (ref <4)
SQUAMOUS EPITHELIAL: <1 /[HPF] (ref <28)
TRANSITIONAL EPITHELIAL CELLS URINE: <1 /[HPF] (ref <6)
WBCS: 2 /[HPF] (ref <6)

## 2022-12-21 LAB — LIPASE: LIPASE: 26 U/L (ref 11–82)

## 2022-12-21 MED ORDER — TAMSULOSIN 0.4 MG CAPSULE: 0.4000 mg | ORAL_CAPSULE | Freq: Every evening | ORAL | 0 refills | Status: DC

## 2022-12-21 MED ORDER — TAMSULOSIN 0.4 MG CAPSULE
0.4000 mg | ORAL_CAPSULE | Freq: Every evening | ORAL | Status: DC
Start: 2022-12-21 — End: 2022-12-21
  Administered 2022-12-21: 0.4 mg via ORAL

## 2022-12-21 MED ORDER — ONDANSETRON 4 MG DISINTEGRATING TABLET
4.0000 mg | ORAL_TABLET | ORAL | Status: AC
Start: 2022-12-21 — End: 2022-12-21
  Administered 2022-12-21: 4 mg via ORAL

## 2022-12-21 MED ORDER — TAMSULOSIN 0.4 MG CAPSULE
ORAL_CAPSULE | ORAL | Status: AC
Start: 2022-12-21 — End: 2022-12-21
  Filled 2022-12-21: qty 1

## 2022-12-21 MED ORDER — ONDANSETRON 4 MG DISINTEGRATING TABLET
4.0000 mg | ORAL_TABLET | Freq: Three times a day (TID) | ORAL | 0 refills | Status: AC | PRN
Start: 2022-12-21 — End: ?

## 2022-12-21 MED ORDER — HYDROCODONE 5 MG-ACETAMINOPHEN 325 MG TABLET: 1.0000 | ORAL_TABLET | Freq: Four times a day (QID) | ORAL | 0 refills | Status: DC | PRN

## 2022-12-21 MED ORDER — ONDANSETRON 4 MG DISINTEGRATING TABLET
ORAL_TABLET | ORAL | Status: AC
Start: 2022-12-21 — End: 2022-12-21
  Filled 2022-12-21: qty 1

## 2022-12-21 NOTE — ED Provider Notes (Signed)
[facility]  ED Primary Provider Note      Name: Selena Duke  Age and Gender: 67 y.o. female  Date of Birth: 1955-08-27  MRN: [id number]  PCP: Yancey Flemings, FNP-BC    CC:  Chief Complaint   Patient presents with    Flank Pain       HPI:  Selena Duke is a 67 y.o. White female who presents to the ER with left flank pain.  She states the pain started this morning and she took a tramadol with no relief.  She has a history of interstitial cystitis, so she states that she always has pelvic pain and dysuria.  She states that she had a kidney stone in July, however was unable to have it surgically removed due to cardiac clearance.  She states she stopped taking Flomax about 3 weeks ago.  She has not followed with Urology recently.  States that she was nausea with vomiting this morning denies diarrhea, chest pain, shortness of breath.  She also reports that she did have a fever this morning.    Below pertinent information reviewed with patient:  Past Medical History:   Diagnosis Date    Interstitial cystitis     Renal calculi            Allergies   Allergen Reactions    Latex  Other Adverse Reaction (Add comment)       Past Surgical History:   Procedure Laterality Date    HX ADENOIDECTOMY      HX APPENDECTOMY      HX CHOLECYSTECTOMY      HX HYSTERECTOMY      HX TONSILLECTOMY          Social History     Socioeconomic History    Marital status: Married   Tobacco Use    Smoking status: Never    Smokeless tobacco: Never   Substance and Sexual Activity    Alcohol use: Never    Drug use: Never       ROS:  No other overt positive review of systems are noted other than stated in the HPI.      Objective:    ED Triage Vitals [12/21/22 1114]   BP (Non-Invasive) (!) 134/97   Heart Rate 70   Respiratory Rate 18   Temperature 36.6 C (97.9 F)   SpO2 97 %   Weight 99.8 kg (220 lb)   Height      Filed Vitals:    12/21/22 1200 12/21/22 1300 12/21/22 1400 12/21/22 1551   BP: 109/73 102/62 118/81 101/63    Pulse: 70 72 71 75   Resp: 18 (!) 21 (!) 23 20   Temp:    (!) 35.8 C (96.5 F)   SpO2: 94% 90% 95% 95%       Nursing notes and vital signs reviewed.    Constitutional - No acute distress.  Alert and Active.  HEENT - Normocephalic. Atraumatic. PERRL. EOMI. Conjunctiva clear. T. Moist mucous membranes.   Neck - Trachea midline. No stridor. No hoarseness.  Cardiac - Regular rate and rhythm. No murmurs, rubs, or gallops. Intact distal pulses.  Respiratory/Chest - Normal respiratory effort. Clear to auscultation bilaterally. No rales, wheezes or rhonchi. No chest tenderness.  Abdomen - Normal bowel sounds. Non-tender, soft, non-distended. No rebound or guarding.   Musculoskeletal - Good AROM. No muscle or joint tenderness appreciated. No clubbing, cyanosis or edema.  Skin - Warm and dry, without any rashes  or other lesions.  Neuro - Alert and oriented x 3. Cranial nerves II-XII are grossly intact.  Moving all extremities symmetrically. Normal gait.  Psych - Normal mood and affect. Behavior is normal            Any pertinent labs and imaging obtained during this encounter reviewed below in MDM.    MDM/ED Course:    Medical Decision Making  Patient presents to the ER with left flank pain.  She states the pain started this morning and she took a tramadol with no relief.  She has a history of interstitial cystitis, so she states that she always has pelvic pain and dysuria.  She states that she had a kidney stone in July, however was unable to have it surgically removed due to cardiac clearance.  She states she stopped taking Flomax about 3 weeks ago.  She has not followed with Urology recently.  States that she was nausea with vomiting this morning denies diarrhea, chest pain, shortness of breath.  She also reports that she did have a fever this morning.  Does not wear but not limited to nephrolithiasis, urinary tract obstruction, urinary tract infection, gastroenteritis.  Patient received 4 mg ODT Zofran with relief of  nausea.  Patient states that she took tramadol and Motrin at home, she feels that the Motrin helped her the best.  See ED course.  CBC and CMP were unremarkable.  Urinalysis showed 24 RBCs.  CT abdomen showed left hydro and left hydroureter due to 2.3 mm stone at the UVJ.  Patient was given tamsulosin in the department.  Patient was educated on not taking the tramadol and taking Motrin and Norco that will be prescribed.  PDMP reviewed.  Patient will follow up with h her primary care to discuss referral to Urology.  She would prefer to go to [place].  No indications for urinary tract infection.  Patient was agreeable to treatment course.  Patient is stable and suitable for discharge.    Amount and/or Complexity of Data Reviewed  Labs: ordered. Decision-making details documented in ED Course.  Radiology: ordered. Decision-making details documented in ED Course.  ECG/medicine tests: independent interpretation performed.    Risk  Prescription drug management.            ED Course as of 12/21/22 1609   Sat Dec 21, 2022   1223 CBC/DIFF(!)  Unremarkable   1223 COMPREHENSIVE METABOLIC PANEL, NON-FASTING(!)  Unremarkable   1223 LIPASE: 26   1223 URINALYSIS, MACROSCOPIC AND MICROSCOPIC W/CULTURE REFLEX(!)  >1000 glucose, 24 RBCs   1515 CT ABDOMEN PELVIS WO IV CONTRAST  Mild left hydronephrosis and left hydroureter due to a 2.3 mm stone at the left ureterovesical junction.     No stone or obstruction on the right.         Orders Placed This Encounter    CT ABDOMEN PELVIS WO IV CONTRAST    CBC/DIFF    COMPREHENSIVE METABOLIC PANEL, NON-FASTING    LIPASE    URINALYSIS, MACROSCOPIC AND MICROSCOPIC W/CULTURE REFLEX    CBC WITH DIFF    URINALYSIS, MACROSCOPIC    URINALYSIS, MICROSCOPIC    CANCELED: ECG 12 LEAD    ondansetron (ZOFRAN ODT) rapid dissolve tablet    tamsulosin (FLOMAX) capsule    ondansetron (ZOFRAN ODT) 4 mg Oral Tablet, Rapid Dissolve    tamsulosin (FLOMAX) 0.4 mg Oral Capsule    tamsulosin (FLOMAX) 0.4 mg Oral  Capsule    HYDROcodone-acetaminophen (NORCO) 5-325 mg Oral Tablet  Impression:   Clinical Impression   Nephrolithiasis (Primary)   Hydronephrosis, unspecified hydronephrosis type       Disposition: Discharged    / M. Toney Sang, APRN, FNP-C 12/21/2022, 11:50  [facility]  Department of Emergency Medicine   [facility]    Portions of this note may have been dictated using voice recognition software.     -----------------------  Results for orders placed or performed during the hospital encounter of 12/21/22 (from the past 12 hour(s))   URINALYSIS, MACROSCOPIC   Result Value Ref Range    COLOR Light Yellow Colorless, Light Yellow, Yellow    APPEARANCE Clear Clear    SPECIFIC GRAVITY 1.016 1.002 - 1.030    PH 7.5 5.0 - 9.0    LEUKOCYTES Negative Negative, 100  WBCs/uL    NITRITE Negative Negative    PROTEIN Negative Negative, 10 , 20  mg/dL    GLUCOSE >1610 (A) Negative, 30  mg/dL    KETONES Negative Negative, Trace mg/dL    BILIRUBIN Negative Negative, 0.5 mg/dL    BLOOD 9.60 (A) Negative, 0.03 mg/dL    UROBILINOGEN Normal Normal mg/dL   URINALYSIS, MICROSCOPIC   Result Value Ref Range    MUCOUS Rare Rare, Occasional, Few /hpf    RBCS 24 (H) <4 /hpf    WBCS 2 <6 /hpf    SQUAMOUS EPITHELIAL <1 <28 /hpf    TRANSITIONAL EPITHELIAL CELLS URINE <1 <6 /hpf   COMPREHENSIVE METABOLIC PANEL, NON-FASTING   Result Value Ref Range    SODIUM 138 136 - 145 mmol/L    POTASSIUM 4.0 3.5 - 5.1 mmol/L    CHLORIDE 103 98 - 107 mmol/L    CO2 TOTAL 25 21 - 31 mmol/L    ANION GAP 10 4 - 13 mmol/L    BUN 19 7 - 25 mg/dL    CREATININE 4.54 0.98 - 1.30 mg/dL    BUN/CREA RATIO 25 (H) 6 - 22    ESTIMATED GFR 85 >59 mL/min/1.30m^2    ALBUMIN 4.6 3.5 - 5.7 g/dL    CALCIUM 9.4 8.6 - 11.9 mg/dL    GLUCOSE 147 (H) 74 - 109 mg/dL    ALKALINE PHOSPHATASE 52 34 - 104 U/L    ALT (SGPT) 40 7 - 52 U/L    AST (SGOT) 37 13 - 39 U/L    BILIRUBIN TOTAL 0.5 0.3 - 1.0 mg/dL    PROTEIN TOTAL 8.1 6.4 - 8.9 g/dL     ALBUMIN/GLOBULIN RATIO 1.3 0.8 - 1.4    OSMOLALITY, CALCULATED 279 270 - 290 mOsm/kg    CALCIUM, CORRECTED 8.9 8.9 - 10.8 mg/dL    GLOBULIN 3.5 2.9 - 5.4   LIPASE   Result Value Ref Range    LIPASE 26 11 - 82 U/L   CBC WITH DIFF   Result Value Ref Range    WBC 10.5 3.8 - 11.8 x10^3/uL    RBC 4.86 3.63 - 4.92 x10^6/uL    HGB 14.6 (H) 10.9 - 14.3 g/dL    HCT 82.9 (H) 56.2 - 41.9 %    MCV 89.6 75.5 - 95.3 fL    MCH 30.1 24.7 - 32.8 pg    MCHC 33.6 32.3 - 35.6 g/dL    RDW 13.0 86.5 - 78.4 %    PLATELETS 255 140 - 440 x10^3/uL    MPV 8.7 7.9 - 10.8 fL    NEUTROPHIL % 69 43 - 77 %    LYMPHOCYTE % 22 16 - 44 %  MONOCYTE % 7 5 - 13 %    EOSINOPHIL % 1 1 - 7 %    BASOPHIL % 1 0 - 1 %    NEUTROPHIL # 7.20 1.85 - 7.80 x10^3/uL    LYMPHOCYTE # 2.30 1.00 - 3.00 x10^3/uL    MONOCYTE # 0.70 0.30 - 1.00 x10^3/uL    EOSINOPHIL # 0.10 0.00 - 0.50 x10^3/uL    BASOPHIL # 0.10 0.00 - 0.10 x10^3/uL     CT ABDOMEN PELVIS WO IV CONTRAST   Final Result   Obstructive uropathy on the left.      Fatty liver.          One or more dose reduction techniques were used (e.g., Automated exposure control, adjustment of the mA and/or kV according to patient size, use of iterative reconstruction technique).         Radiologist location ID: [id number]

## 2022-12-21 NOTE — ED Triage Notes (Signed)
C/O L FLANK PAIN SINCE THIS AM. HX KIDNEY STONE.

## 2022-12-21 NOTE — ED APP Handoff Note (Signed)
[facility] - Emergency Department  Emergency Department  Provider in Triage Note    Name: Selena Duke  Age: 67 y.o.  Gender: female     Subjective:   Selena Duke is a 67 y.o. female who presents with complaint of Flank Pain  .  Pt presents to the ER for evaluation of left flank pain, sweats, stating she has a kidney stone. She states she did take a pain pill and ibuprofen prior to coming to ER. She was seen for kidney stone 2 months ago. Pain 5/10.     Objective:   There were no vitals filed for this visit.   Focused Physical Exam shows pt sitting in chair. She is alert. Resp even and nonlabored. No distress note     Assessment:  A medical screening exam was completed.  This patient is a 67 y.o. female with initial findings showing Left flank pain.    Plan:  Please see initial orders and work-up below.  This is to be continued with full evaluation in the main Emergency Department.     No current facility-administered medications for this encounter.     No results found for this or any previous visit (from the past 24 hour(s)).     Roma Kayser, APRN  12/21/2022, 11:13

## 2022-12-21 NOTE — Discharge Instructions (Signed)
Drink plenty of fluids. Continue any at home medications as previously prescribed. Take medications that are prescribed from today's visit as prescribed. Discuss any questions you may have concerning your medications with your pharmacist. Follow up with your regular PCP in the next 2-3 days. Return to the ED if symptoms worsen, change, or do not improve.

## 2022-12-21 NOTE — ED Nurses Note (Signed)
PT DCD HOME, DC INSTRUCTIONS REVIEWED, AND PT STATES VERBAL UNDERSTANDING, PRESCRIPTION SENT TO [facility] PER PTS REQUEST, FOR ZOFRAN, FLOMAX, LORTAB, AND INSTRUCTED TO FOLLOW UP W/[facility] AND He IS SUPPOSE TO MAKE HER APPOINTMENT W/[facility]. IV DCD, PT AMBULATED OUT.

## 2022-12-21 NOTE — ED Nurses Note (Signed)
REC TO ED 18 AMBULATORY FROM TRIAGE W/C/O PAINFUL URINATION, FEVER AND CHILLS SINCE EARLY THIS AM, SAYS She KNOWS IT IS A KIDNEY STONE, HAS HAD STONES IN THE PAST. ALSO C/O SOME NAUSEA. MEDICATED W/ZOFRAN PER ORDER. CALL BELL AT BS

## 2022-12-30 NOTE — ED Provider Notes (Signed)
The following was dictated with voice recognition software and may contain typos. Call [phone] to speak with the author and clarify any incorrect data.      ED Provider Note      Subjective   Chief complaint and review of nursing and triage notes: Left neck, left shoulder, left arm pain x 2 weeks, worse when driving, worsening over time, no associated chest pain or dyspnea.    HPI  Around 12/20/2022 the patient attempted to look over her left shoulder when she was driving and this resulted in immediate and severe pressure-like pain in her neck and down both shoulders but especially into the left shoulder.  She also had paresthesias radiating down to both of her hands.  She is experienced no weakness, nausea, vomiting, dysarthria, dysphagia, fevers, chills, weight loss, cough, or other acute symptoms.  Her pain consistently worsens with any movement of her neck.  Due to the persistence of symptoms despite treatment with ibuprofen, she presented to the emergency department for further evaluation.    The patient has a history of coronary artery disease.    Objective     Vitals <redacted file path>:  Patient Vitals for the past 24 hrs:   BP Temp Pulse Resp SpO2 Height Weight   12/30/22 1439 135/73 98.1 F (36.7 C) 79 20 94 % 1.549 m (5\' 1" ) 99.8 kg (220 lb)       Exam <redacted file path>:  Constitutional: Normal stature and development, appears stated age.    Psychiatric: alert, oriented, pleasant, cooperative    Dermatologic: warm, well-perfused, dry, normal turgor, no rash, no nail pitting or hypertrophy    Ophthalmic: sclerae white, conjunctiva pink, pupils equal round and reactive to light, visual acuity grossly normal, glasses in situ    Respiratory: Normal bilateral breath sounds, no accessory muscle use, normal rate and depth.    Cardiovascular: S1, S2, no murmurs, rubs, or clicks. Equal pulses, no edema.    Neuro: Cranial Nerves: I--not tested; II-XII intact. Motor: normal muscle bulk and tone. Strength  5/5 throughout. Cerebellar--No dysdiadochokinesia, no dysmetria. Gait - steady with normal base. Romberg--maintains balance with eyes closed. Sensory: light touch intact. Reflexes: Symmetric biceps, patellar, and achilles. No clonus.    MSK: There is no muscle spasm to palpation of the trapezius or sternocleidomastoids.  Tender to palpation of the cervical spine.  No step-offs or crepitus.  The patient is able to raise her arms over her head and placed them behind her back without difficulty.    EKG  EKG demonstrates sinus rhythm, rate 81, left axis deviation, normal amplitude, QTc 487 ms, no ST segment elevations or depressions, no T wave inversions    Labs  Results for orders placed or performed during the hospital encounter of 12/30/22   CBC WITH AUTO DIFF (CBCD)   Result Value Ref Range    WBC 8.9 4.0 - 10.5 K/uL    RBC 4.58 4.1 - 5.1 M/uL    Hemoglobin 13.9 12.0 - 16.0 g/dL    Hematocrit 09.8 36 - 46 %    MCV 90.6 78 - 98 fL    MCH 30.3 27.0 - 34.6 pg    MCHC 33.5 30.0 - 35.4 g/dL    RDW 11.9 14.7 - 82.9 %    Platelet Count 287 130 - 400 K/uL    MPV 10.0 9.4 - 12.4 fL    Immature Granulocyte 0.1 %    Seg 40.0 %    Lymph 44.9 %  Monos 9.7 %    Eos 4.6 %    Baso 0.7 %    Absolute Neut 3.6 1.8 - 7.7 K/uL    Absolute Lymph 4.0 1.0 - 5.0 K/uL    Absolute Mono 0.9 (H) 0 - 0.8 K/uL    Absolute Eos 0.4 0.0 - 0.4 K/uL    Absolute Basophils 0.1 0.0 - 0.2 K/uL    Absolute Immature Granulocyte 0.0 0.00 - 0.02 K/uL   COMPREHENSIVE METABOLIC PANEL(COMP)   Result Value Ref Range    Sodium 144 136 - 148 MMOL/L    Potassium 4.1 3.5 - 5.2 MMOL/L    Chloride 107 98 - 108 MMOL/L    CO2 26 20 - 32 MMOL/L    Urea Nitrogen 16 7 - 23 MG/DL    Creatinine 1.61 0.96 - 1.02 MG/DL    Glucose, Bld 045 (H) 74 - 106 mg/dL    Total Protein 7.2 5.7 - 8.2 G/DL    Albumin 3.8 3.2 - 5.0 G/DL    Calcium 9.1 8.5 - 40.9 MG/DL    Total Bilirubin 0.5 0.2 - 1.1 MG/DL    Alkaline Phosphatase, Serum 68 46 - 116 U/L    AST 47 (H) 15 - 37 U/L    ALT 55 14  - 59 U/L    Globulin 3.4 1.7 - 3.9 G/DL    Albumin/Globulin 1.1 0.7 - 2.3 RATIO    Anion Gap 11 3 - 12 mmol/L    Osmolality Calc 301 278 - 305 MOS/KG    Bun/Creatinine 27.6 (H) 7 - 20    Glom Filt Rate, Estimated >90 >60 ml/min/1.73 m2   TROPONIN I HIGH SENSITIVITY (TROPHS)   Result Value Ref Range    Troponin HS 4 <37 ng/L   EKG (12-LEAD)   Result Value Ref Range    Ventricular Rate 81 BPM    Atrial Rate 81 BPM    P-R Interval 150 ms    QRS Duration 88 ms    Q-T Interval 420 ms    QTC Calculation(Bezet) 487 ms    P Axis 36 degrees    R Axis -20 degrees    T Axis 17 degrees    Diagnosis Line       Normal sinus rhythm  Voltage criteria for left ventricular hypertrophy  Abnormal ECG  No previous ECGs available         Imaging  EXAM:  CT Cervical Spine Without Intravenous Contrast.      CLINICAL HISTORY:  (pain in the left neck and left shoulder with bilateral upper extremity paresthesias)  pain in the left neck and left shoulder with bilateral upper extremity paresthesias     TECHNIQUE:  Axial computed tomography images of the cervical spine without intravenous contrast. Sagittal and coronal reformations performed.      COMPARISON:  None provided.     FINDINGS:     BONES:  The facet joints, spinous processes, and odontoid process are intact.   There is no evidence of acute fracture or subluxation.      DISCS / DEGENERATIVE CHANGES:  Degenerative changes are noted with disc osteophyte complexes, most pronounced at the C5-6 level contributing to mild/moderate canal stenosis and moderate/severe bilateral foraminal stenosis, right greater than left at this level.      SOFT TISSUES:  Straightening of the normal curvature of the cervical spine can be seen with positioning/immobilization and muscle spasm.   Paraspinous musculature and prevertebral soft tissue are within normal limits.  MISCELLANEOUS:  Calcific plaquing is seen at the bilateral carotid bulbs, left greater than right.      IMPRESSION:     Degenerative  changes are noted contributing to stenosis, most pronounced at the C5-6 level as detailed above. No evidence of acute fracture or subluxation.    EXAM:  CT Thoracic Spine Without Intravenous Contrast.      CLINICAL HISTORY:  (pain in the thoracic spine with paresthesias down the arms.)  pain in the thoracic spine with paresthesias down the arms.     TECHNIQUE:  Axial computed tomography images of the thoracic spine without intravenous contrast. Sagittal and coronal reformations performed.      CONTRAST:  None.     COMPARISON:  None provided.     FINDINGS:     BONES:  Vertebral body heights and alignment are maintained.   There is no evidence of acute fracture or subluxation.      DISCS / DEGENERATIVE CHANGES:  Degenerative changes are seen with multilevel marginal osteophytes.      SOFT TISSUES:  Paraspinous musculature and soft tissue are within normal limits.      MISCELLANEOUS:  No significant canal stenosis appreciated.      IMPRESSION:     No evidence of acute fracture or subluxation.    Assessment and Plan:     Clinical Impression <redacted file path>:  #Musculoskeletal pain  - CT of the cervical spine demonstrated there was no acute fracture or dislocation but the patient states that she has severe pain and it is inferior to the region which I imaged and also different from where she reported pain on my first exam  - CT of the thoracic spine is pending with signout to Dr. Jimmey Ralph for ultimate disposition.  I do not suspect an acute fracture or dislocation.    Condition: Stable    Disposition <redacted file path>:  Shift Change: Final Disposition was pending at the time of shift change.  Case was discussed with and signed out to Dr. Jimmey Ralph while awaiting imaging results and treatment response  Carney Harder, MD    ED Course and Billing Information:     ED Course <redacted file path>:   ED Course as of 01/04/23 1616   Mon Dec 30, 2022   1501 EKG demonstrates sinus rhythm, rate 81, left axis deviation, normal  amplitude, QTc 487 ms, no ST segment elevations or depressions, no T wave inversions [RB]   1726 Metabolic panel demonstrates no acidosis, acute kidney injury, or biliary tree disease.  There is clinically insignificant elevation of AST.  High-sensitivity troponin is not detectable and I do not suspect acute coronary syndrome.  Hematology panel demonstrates no anemia, no leukocytosis or thrombocytosis and it is not consistent with an infectious or inflammatory process. [RB]   1949 Following CT of the cervical spine which demonstrates degenerative changes but no acute fracture or dislocation, the patient now informs me that she has pain which is below the level of her neck.  She states that in the upper portion of her back between her shoulder blades, she has pain and is concerned there is impingement of nerves in this region.  I have ordered a CT of the thoracic spine as well as a dose of gabapentin to treat her symptoms. [RB]   2050 I reviewed CT images of the patient's thoracic spine which demonstrate degenerative changes but no acute fractures or dislocations, no retropulsion of fragments into the spinal canal. [RB]  ED Course User Index  [RB] Carney Harder, MD     Medical Decision Making  Problems Addressed:  Acute midline thoracic back pain: acute illness or injury    Amount and/or Complexity of Data Reviewed  Labs: ordered. Decision-making details documented in ED Course.  Radiology: ordered and independent interpretation performed. Decision-making details documented in ED Course.  ECG/medicine tests: ordered and independent interpretation performed. Decision-making details documented in ED Course.    Risk  OTC drugs.  Prescription drug management.

## 2023-01-08 IMAGING — MR MRI THORACIC SPINE WITHOUT CONTRAST
4 of 6 series · 19 of 48 positions shown · IV contrast (gadolinium)
Comparison: CT thoracic spine from outside dated 12/30/2022.

﻿EXAM:  [id number]   MRI THORACIC SPINE WITHOUT CONTRAST
INDICATION: 67-year-old female with neck pain and upper back pain. Radicular symptoms to the left shoulder and left arm.  No history of trauma, malignancy or back surgery.
TECHNIQUE: Multiplanar, multisequential MRI of the thoracic spine was performed without gadolinium contrast.  Body coil was used instead of spine coil due to the large body habitus which makes examination suboptimal in quality. Overall quality is acceptable.

[Series 7: T2 · sagittal · 5.0mm · 0.88mm/px · 8 of 11 slices shown (1 of 2)]
[im 1/11]
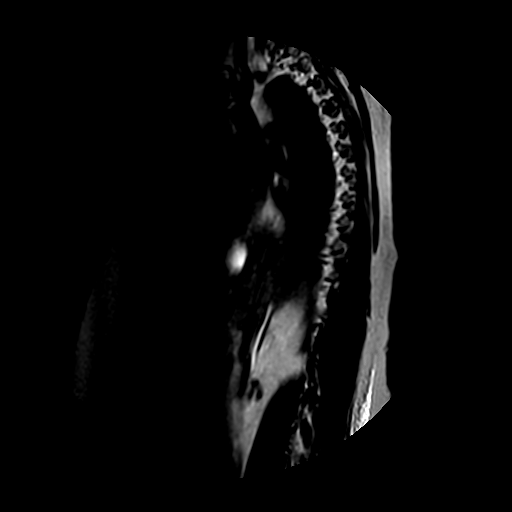
[im 2/11]
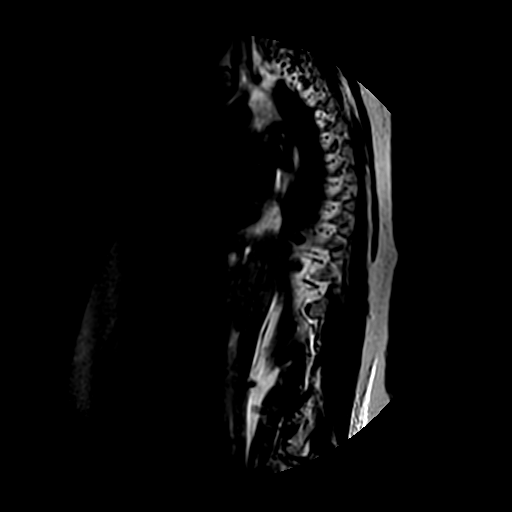
[im 3/11]
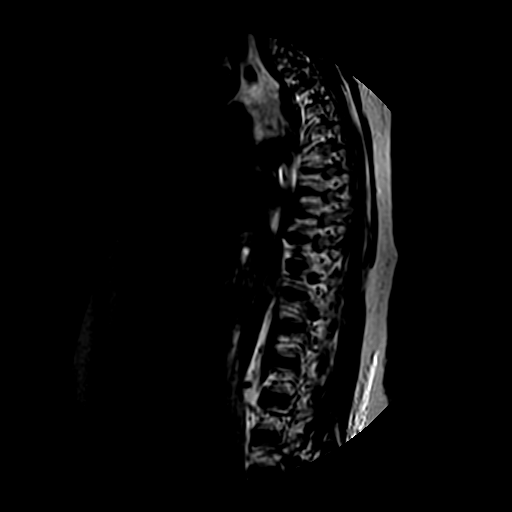
[im 5/11]
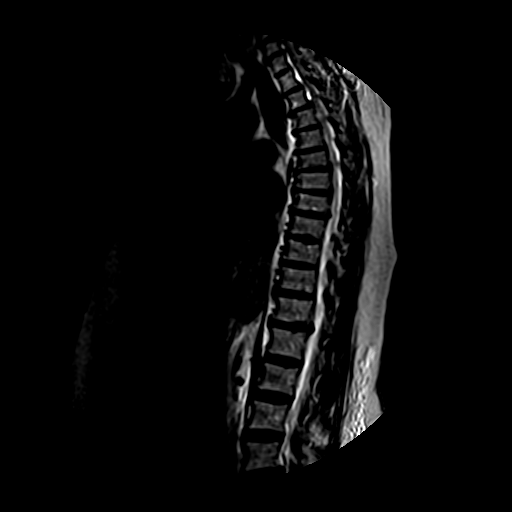
[im 6/11]
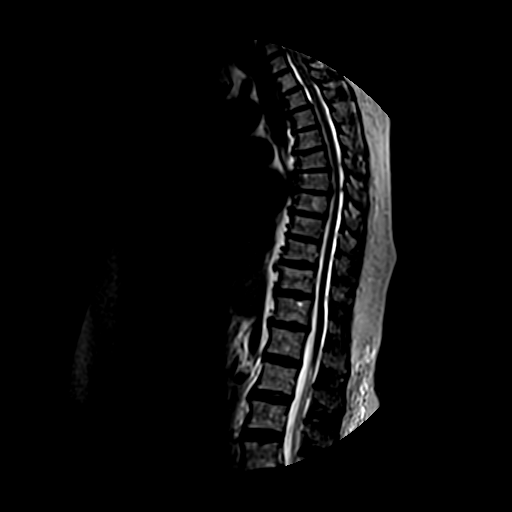
[im 8/11]
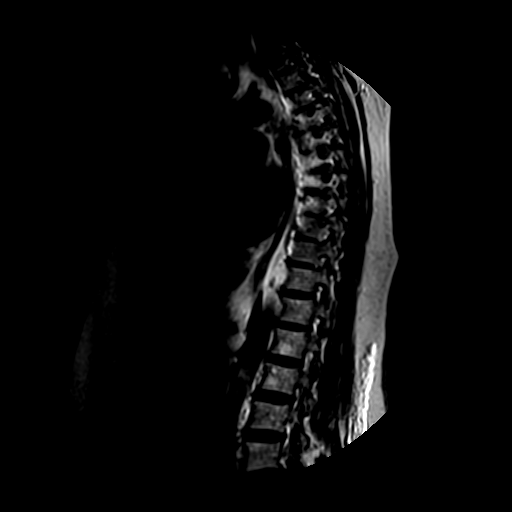
[im 9/11]
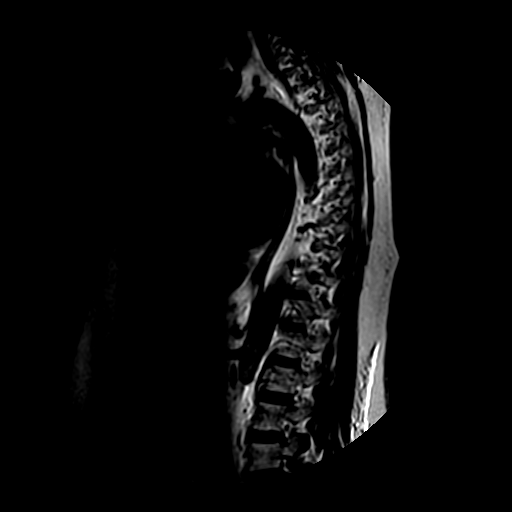
[im 11/11]
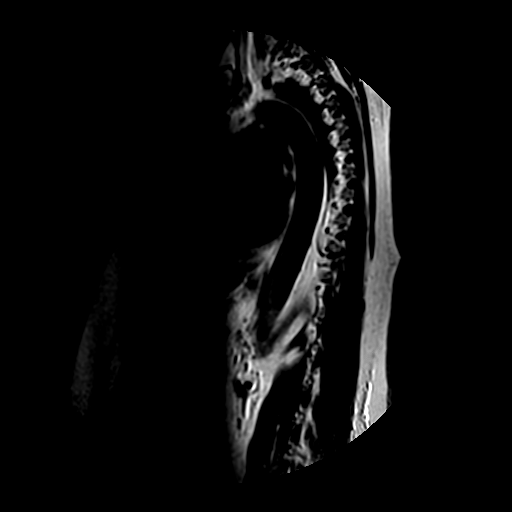

[Series 8: T1 · sagittal · 5.0mm · 0.88mm/px · 3 of 11 slices shown (1 of 2)]
[im 2/11]
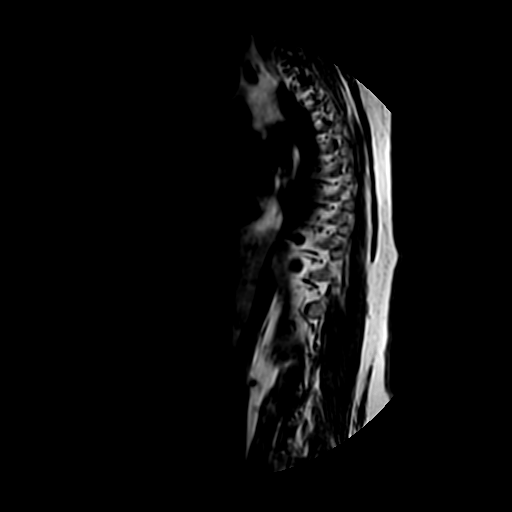
[im 6/11]
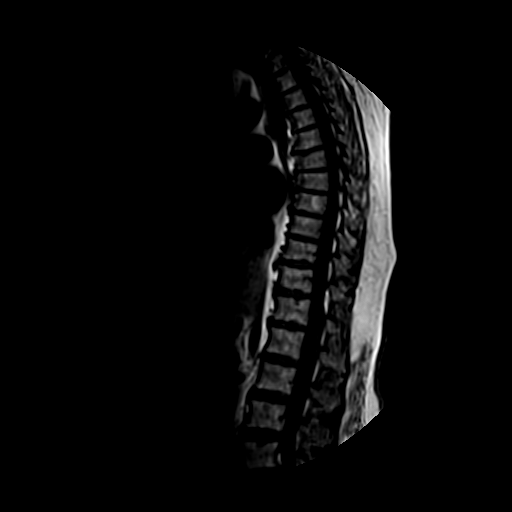
[im 9/11]
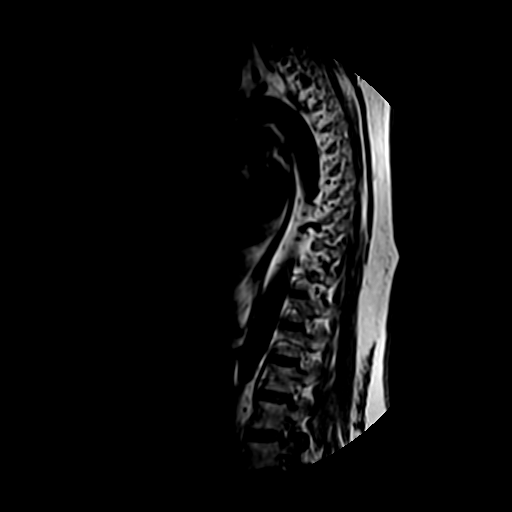

[Series 10: T2 · axial · 5.0mm · 0.45mm/px · z∈[-267,-112]mm · 5 of 13 slices shown (2 of 2)]
[im 1/13]
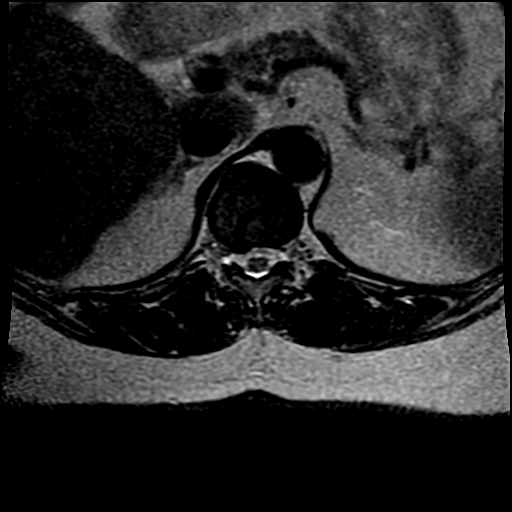
[im 2/13]
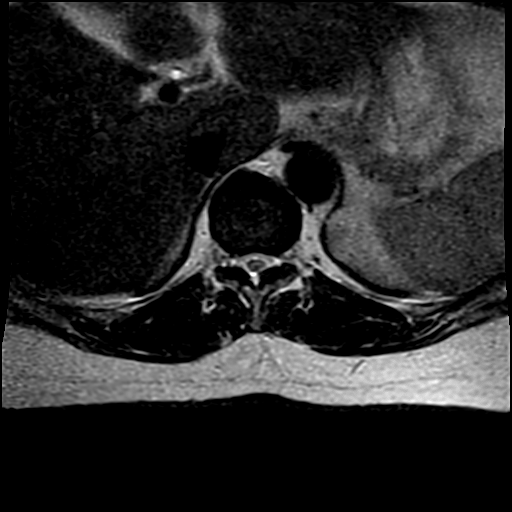
[im 5/13]
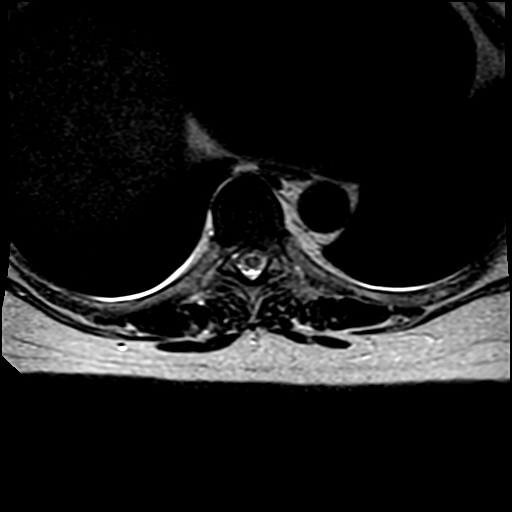
[im 7/13]
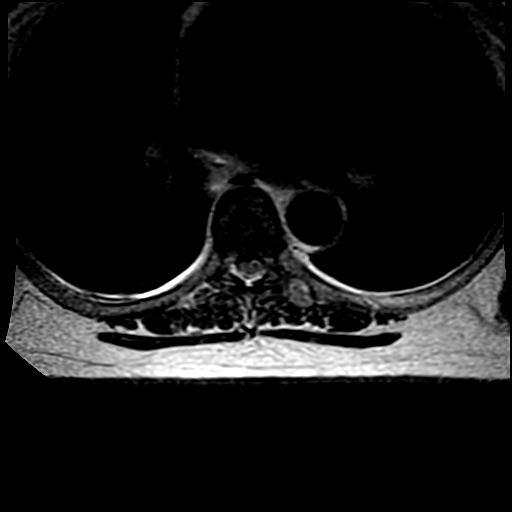
[im 11/13]
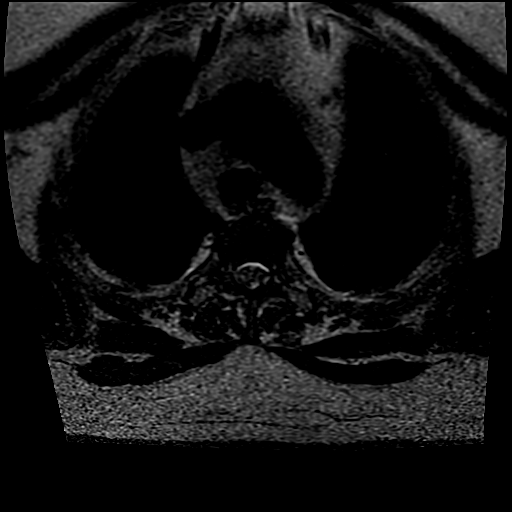

[Series 11: T1 · axial · 5.0mm · 0.45mm/px · z∈[-240,-112]mm · 3 of 13 slices shown (2 of 2)]
[im 2/13]
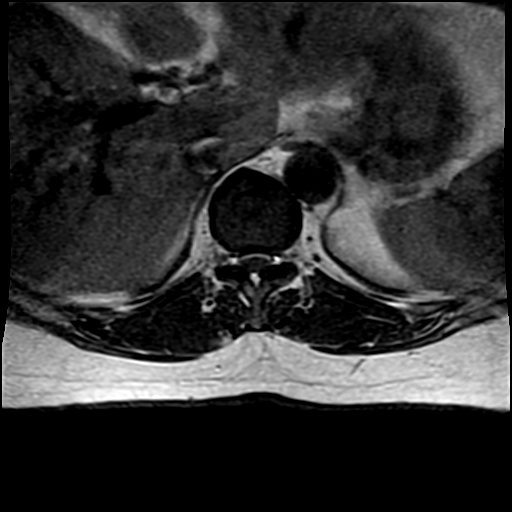
[im 7/13]
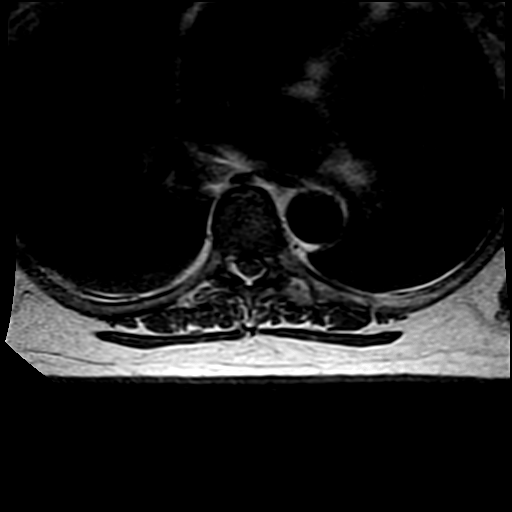
[im 11/13]
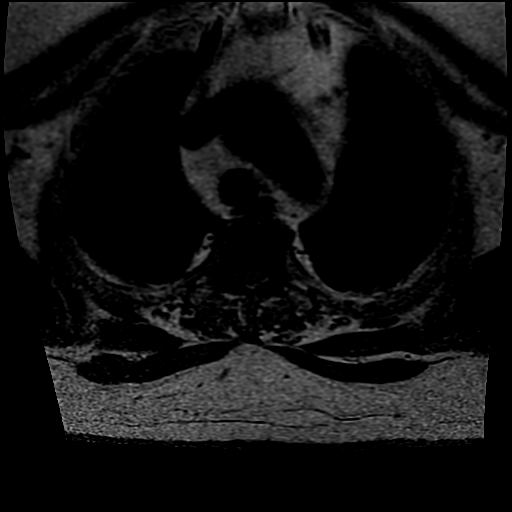

[19 of 48 positions shown; findings below may reference images not displayed]

FINDINGS: No acute bony lesions of thoracic vertebral body are noted.  Mild degenerative disc changes are seen in the midthoracic spine at T6-7, T7-8 and T8-9 levels with minimal compromise of thecal sac in the midline.  Bulging annulus on the right side at T12-L1 level causing mild compromise of right lateral recess.

Thoracic spinal cord shows no focal abnormalities.  Paravertebral soft tissues are unremarkable.
IMPRESSION: 1. No acute or focal bone changes of thoracic vertebrae.

2. Mild-to-moderate degenerative disc changes in the lower thoracic spine and midthoracic spine as described above. 

3. The thoracic spinal cord shows no focal abnormalities. Paravertebral soft tissues are unremarkable.

## 2023-01-08 IMAGING — MR MRI CERVICAL SPINE WITHOUT CONTRAST
4 of 5 series · 24 of 48 positions shown · IV contrast (gadolinium)
Comparison: CT scan from outside dated 12/30/2022.

﻿EXAM:  [id number]   MRI CERVICAL SPINE WITHOUT CONTRAST
INDICATION: 67-year-old female with neck pain and upper back pain with radicular symptoms to left shoulder and upper extremity.
TECHNIQUE: Multiplanar, multisequential MRI of the C-spine was performed without gadolinium contrast.

[Series 5: T2 · sagittal · 3.5mm · 0.75mm/px · 8 of 13 slices shown (1 of 2)]
[im 1/13]
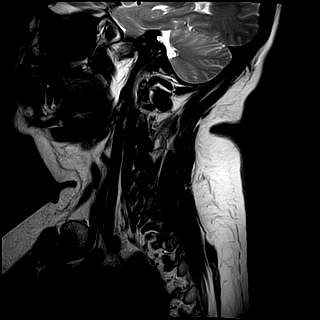
[im 2/13]
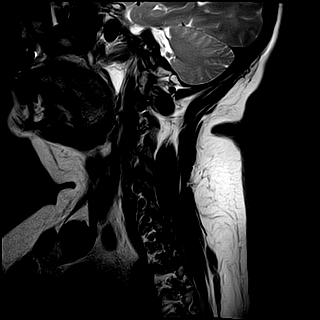
[im 4/13]
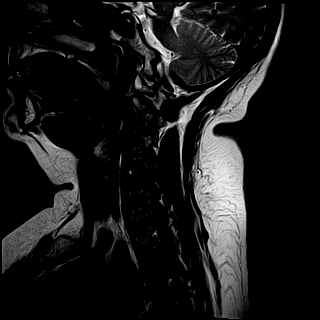
[im 6/13]
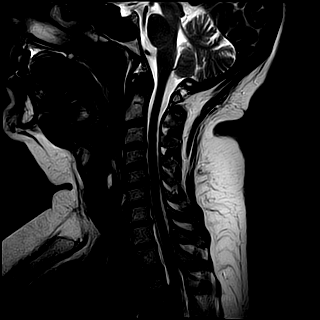
[im 7/13]
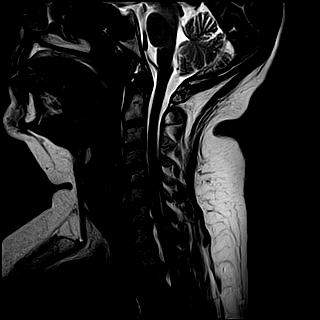
[im 9/13]
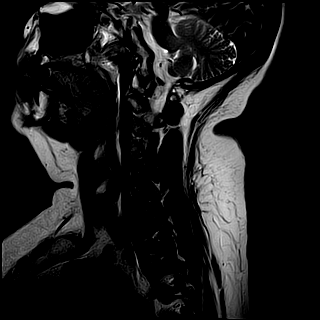
[im 11/13]
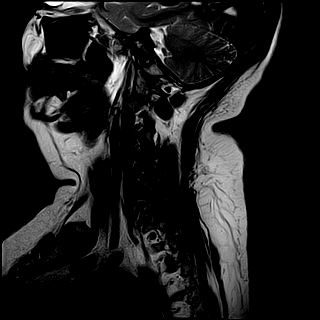
[im 13/13]
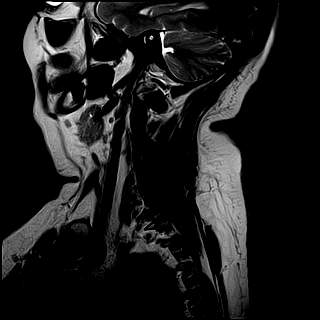

[Series 6: T1 · sagittal · 3.5mm · 0.47mm/px · 4 of 13 slices shown]
[im 1/13]
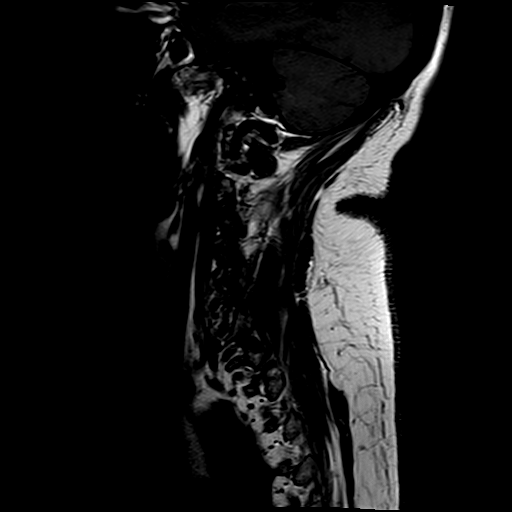
[im 2/13]
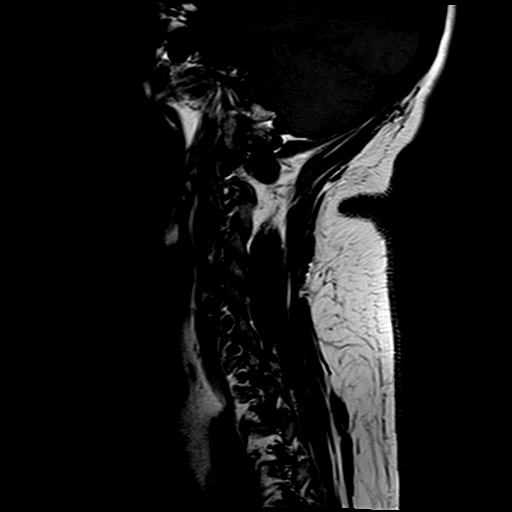
[im 7/13]
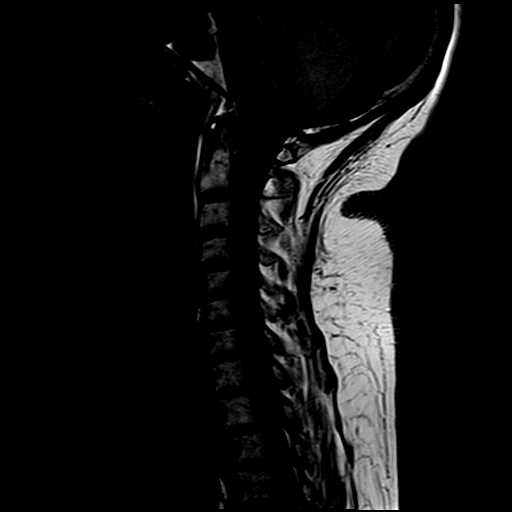
[im 11/13]
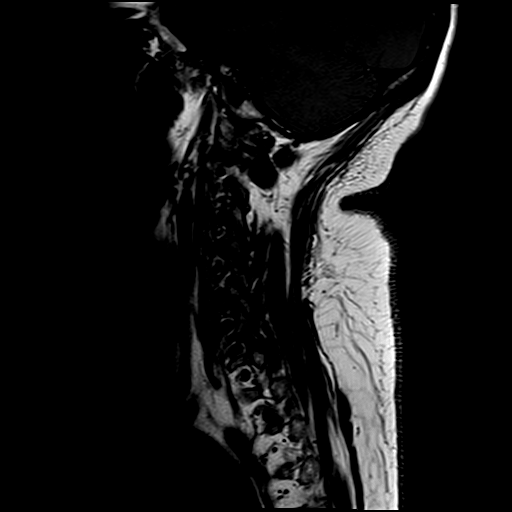

[Series 7: STIR · sagittal · 3.5mm · 0.47mm/px · 3 of 13 slices shown]
[im 2/13]
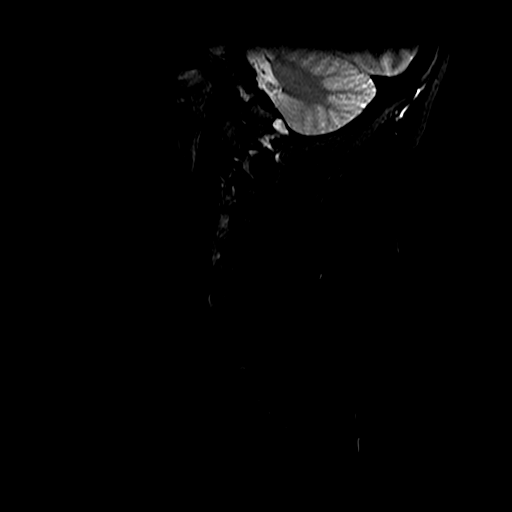
[im 7/13]
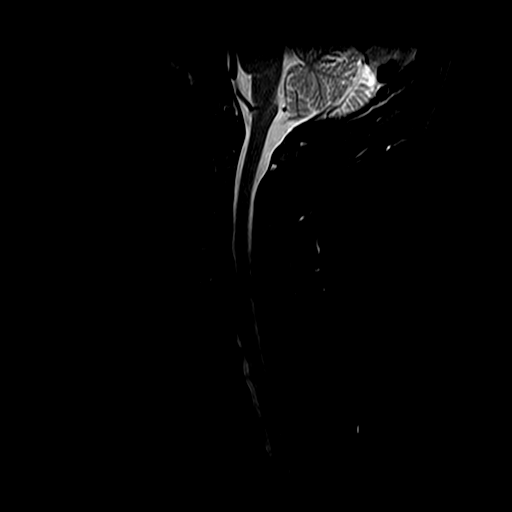
[im 11/13]
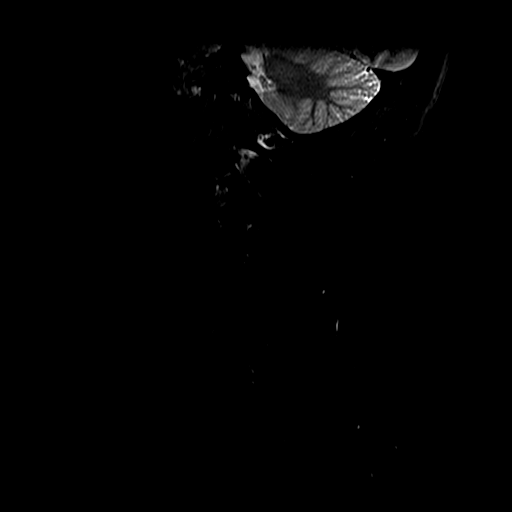

[Series 9: T2 · axial · 3.0mm · 0.39mm/px · z∈[-104,-11]mm · 9 of 18 slices shown (2 of 2)]
[im 1/18]
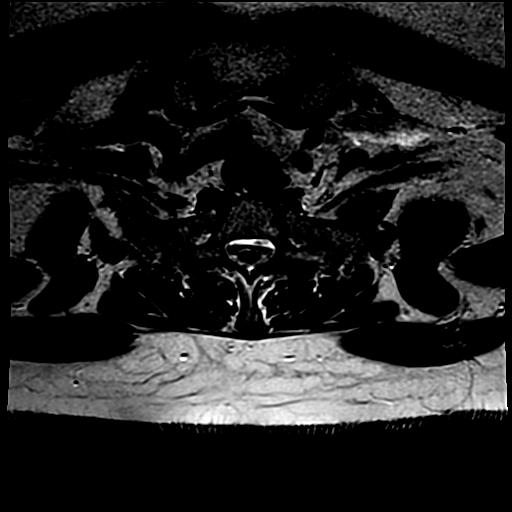
[im 4/18]
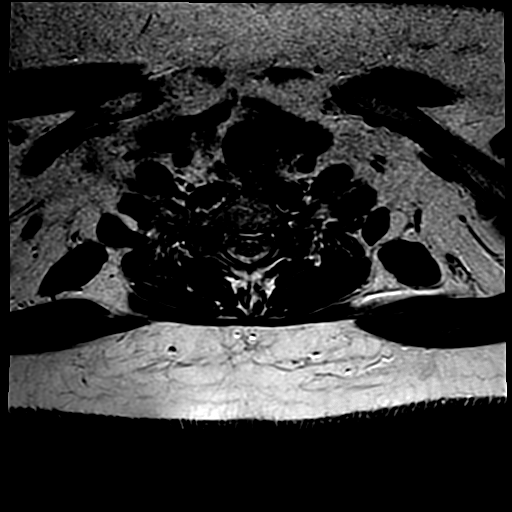
[im 5/18]
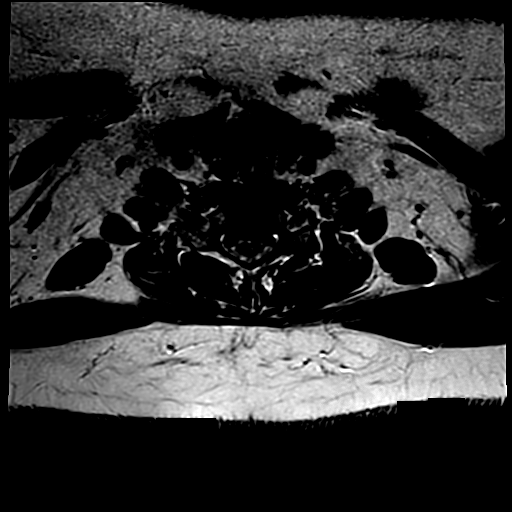
[im 8/18]
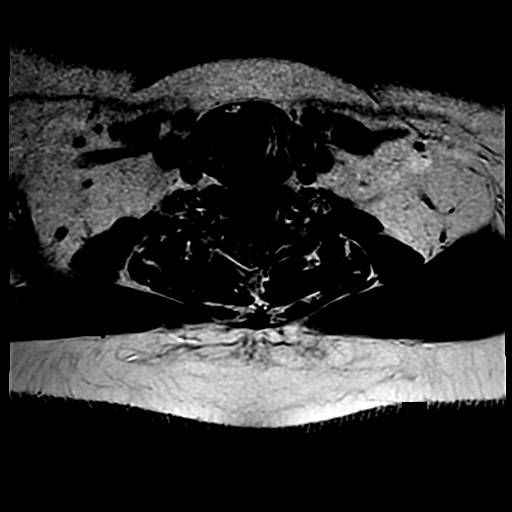
[im 10/18]
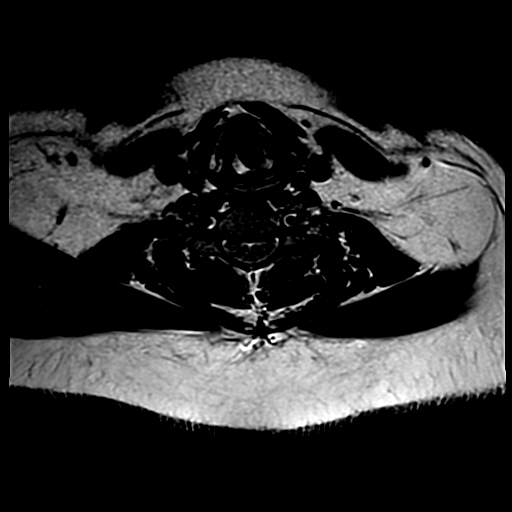
[im 13/18]
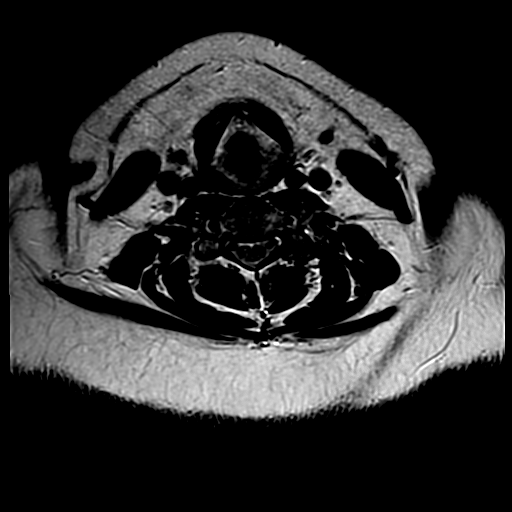
[im 14/18]
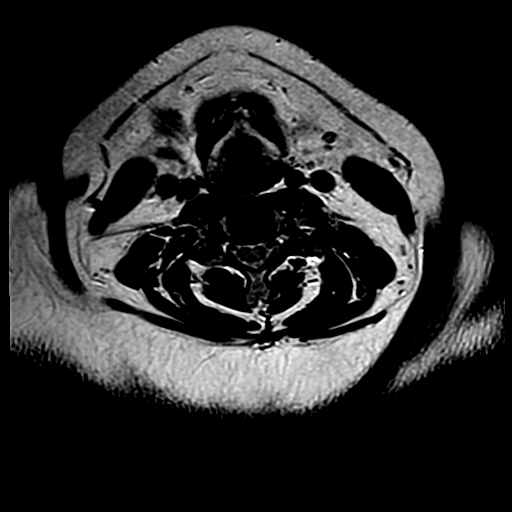
[im 16/18]
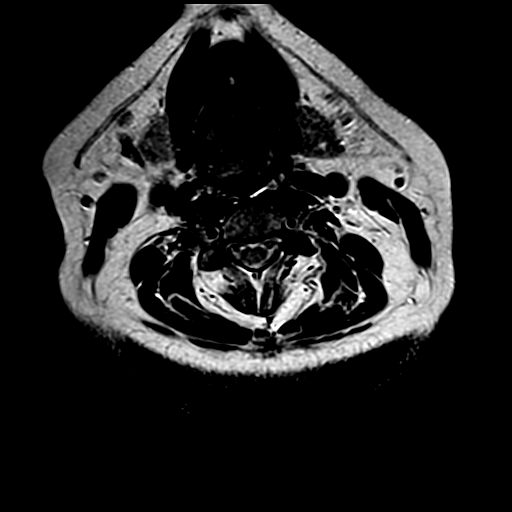
[im 18/18]
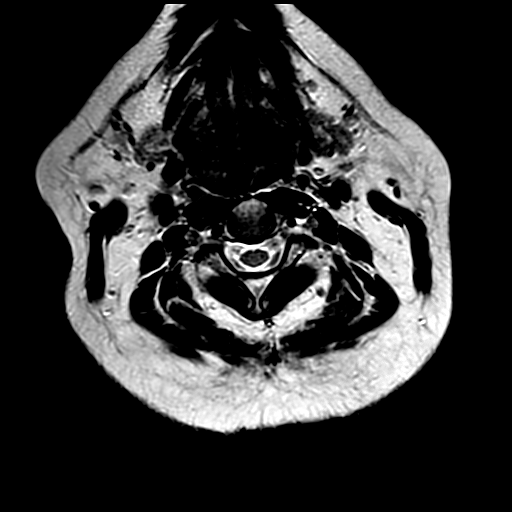

[24 of 48 positions shown; findings below may reference images not displayed]

FINDINGS: No acute bony lesions of cervical vertebrae.  Structures at the foramen magnum are normal. 

C2-3 disc shows no focal abnormalities.  No significant focal abnormalities at C3-4 and C4-5 levels.

At C5-6 level, degenerative disc disease with bulging annulus and prominent disc osteophyte complex are causing moderate compromise of thecal sac in the midline and compromise of both lateral recess and neural foramina. AP diameter of thecal sac in the midline measures 8.3 mm. 

At C6-7 level, no focal disc lesions are seen. 

At C7-T1 level, degenerative disc disease with bulging annulus is causing mild to moderate compromise of thecal sac in the midline and both neural foramina.

Cervical spinal cord shows no focal lesions.
IMPRESSION: 1. No acute bony lesions of cervical vertebrae. 

2. Degenerative disc changes predominantly at C5-6 and C7-T1 levels as described above in detail.

3. Cervical spinal cord shows no focal lesions.

## 2023-01-15 ENCOUNTER — Encounter (HOSPITAL_COMMUNITY): Payer: Self-pay

## 2023-01-15 ENCOUNTER — Other Ambulatory Visit (HOSPITAL_COMMUNITY): Payer: Self-pay | Admitting: Family

## 2023-01-15 ENCOUNTER — Other Ambulatory Visit: Payer: Self-pay

## 2023-01-15 ENCOUNTER — Ambulatory Visit (HOSPITAL_COMMUNITY)
Admission: RE | Admit: 2023-01-15 | Discharge: 2023-01-15 | Disposition: A | Discharge status: Still In Hospital | Payer: MEDICARE | Source: Ambulatory Visit | Attending: Family | Admitting: Family

## 2023-01-15 DIAGNOSIS — M546 Pain in thoracic spine: Secondary | ICD-10-CM

## 2023-01-15 DIAGNOSIS — M542 Cervicalgia: Secondary | ICD-10-CM | POA: Insufficient documentation

## 2023-01-15 DIAGNOSIS — Z8739 Personal history of other diseases of the musculoskeletal system and connective tissue: Secondary | ICD-10-CM

## 2023-01-15 DIAGNOSIS — M5412 Radiculopathy, cervical region: Secondary | ICD-10-CM | POA: Insufficient documentation

## 2023-01-15 NOTE — PT Evaluation (Signed)
[facility]  Outpatient Physical Therapy  [address]  [place], [address]  [phone]  (Fax) [phone]      Physical Therapy Cervical Evaluation    Date: 01/15/2023  Patient's Name: Selena Duke  Date of Birth: 1955/12/16  Physical Therapy Evaluation     Evaluating Physical Therapist: Marcell Anger, PT, DPT  PT diagnosis/Reason for Referral: cervical pain, thoracic pain, degenerative disc changes  Next Scheduled Physician Appointment: TBD  Allergies/Contraindications: Latex      SUBJECTIVE  Date of onset: 2-3 years ago    Mechanism of injury: Fell flat on face    Current Presentation: Patient presents with neck pain, upper back pain, and RUE radicular pain with occasional LUE radicular symptoms.  Symptoms will go all the way down to the finger tips, especially when sitting in her recliner.  Patient relates walking and moving make her symptoms worse.  States when she walks, she has to hold her left arm up against her chest because distraction force increases pain.  Also cannot lay on either shoulder.    PLOF: chronic pain since fall 2-3 years ago.  Husband passed away in 09-20-22 so she it doing a lot of physical moving right now    Past Medical History:   Past Medical History:   Diagnosis Date    Interstitial cystitis     Renal calculi          Past Surgical History:   Past Surgical History:   Procedure Laterality Date    HX ADENOIDECTOMY      HX APPENDECTOMY      HX CHOLECYSTECTOMY      HX HYSTERECTOMY      HX TONSILLECTOMY         Previous episodes/treatments: Had PT several years ago which did help some    Medications for this problem:  OTC pain medication    Diagnostic tests:   CT Scan IMPRESSION:     Degenerative changes are noted contributing to stenosis, most pronounced at the C5-6 level as detailed above. No evidence of acute fracture or subluxation.     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    Patient goals:  REDUCE PAIN    Occupation:  Retired    Next MD visit:     Pain location: Neck, upper back, and left arm                    Pain description:  "knot" to the left of the spine and radiates from the shoulder blade to the left arm    Pain frequency:  CONTINUOUS    Pain rating: Now 5   Best 2   Worst 10    Radiculopathy: LUE    Pain increases with: POSITION CHANGE, ADLs, ACTIVITY, WALK, BENDING, and LIFTING           decreases with : HEAT, COLD, and MEDICATION    Sensation: altered in the LUE    Weakness: Bilateral arm and grip, L>R    Sleep affected: Disrupted often through the night     Headaches: none    Dizziness: denies    Subjective Functional Reports:    Sitting: WFL    Standing: LIMITED    Walking: LIMITED    Lifting: LIMITED        Patient-Specific Functional Score:    Problem Score   1. Driving 1   2. House  ADLs like vacuuming and mopping 1   3.     Total score = sum of the activity scores/number of activities    Minimal detectable change (90% CI) for avg score = 2 points    Minimal detectable change (90% CI) for single activity score = 3 points  1         OBJECTIVE    ROM   right left   rotation 55 55   sidebend 33 20   flexion 49 -------------------------   extension 15 -------------------------     Comments: crunching reported in all directions, worst pain with left side bending    Seated shoulder flexion 135 degrees bilaterally with significantly increased tightness reported on the left UE    Strength  3+/5 throughout, pain inhibition     JAMAR L: 20#  JAMAR R: 38#    Palpation: Levator     Posture: INCREASED THORACIC KYPHOSIS, FORWARD HEAD, and DOWAGER'S HUMP    Reflexes    Biceps N/t guarding   Triceps N/t guarding   Brachioradialis N/t guarding      Special tests: limited due to high acuity of pain, but did respond favorably to gentle cervical distraction.  Experienced rebound pain    Treatment provided:REVIEW OF POC AND GOALS WITH PATIENT, ALL QUESTIONS ANSWERED, PATIENT EDUCATION, and THERAPEUTIC  EXERCISE     Access Code: [email or web address]  URL: [email or web address]  Date: 01/15/2023  Prepared by: Trinna Post Ryin Schillo    Exercises  - Seated Scapular Retraction  - 2 x daily - 7 x weekly - 1 sets - 12 reps - 3 second hold  - Gentle Levator Scapulae Stretch  - 2 x daily - 7 x weekly - 1 sets - 2 reps - 30 seconds hold  - Median Nerve Flossing - Tray  - 2 x daily - 7 x weekly - 1 sets - 15 reps      ASSESSMENT    Impression: Ms. Madrid presents to [facility] with chronic neck pain and more recently with radicular pain down the LUE.  She exhibits decreased cervical ROM, decreased shoulder ROM, decreased grip and shoulder strength, cervical radiculopathy, decreased levator scapula flexibility, and median nerve tension.  She would benefit from PT to address these deficits in order for the patient to return to house ADLs without limiting radicular pain.    Rehab potential: FAIR    Goals:    Short-Term Goals: (3 Weeks):     - Patient will demonstrate improved cervical AROM to at least 70 degrees in sagittal plane to aid in completion of ADLs.     -  Patient will demonstrate independence with progressive HEP to maximize gains from PT.      - Patient will report max 6/10 pain to aid in completion of ADLs/work duties.           Long-Term Goals: (6 Weeks):     - Patient will demonstrate improved L UE strength of at least 4/5 to aid in completion of ADLs.     - Patient will demonstrate improved cervical AROM to at least 80 degrees in the sagittal to aid in completion of ADLs.     -  Patient will demonstrate 10 overhead presses with 3# and good form to aid in completion of ADLs and IADLs.     -  Patient will report max 3/10 pain to aid in completion of ADLs/work duties.     -  Patient will demonstrate improved functional ability in daily life via improved Patient  Specific Functional score of at least 6.             PLAN  Patient will attend 2 times per week x 6 weeks. Therapy may include, but is not limited to THERAPEUTIC EXERCISES,  MYOFASCIAL/JOINT MOBILIZATION, POSTURE/BODY MECHANICS, ERGONOMIC TRAINING, TRANSFER/GAIT TRAINING, HOME INSTRUCTIONS, HEAT/COLD, ULTRASOUND, ELECTRICAL STIMULATION, and KINESIOTAPE    Plan for next visit: initiate gentle supine cervical distraction, suboccipital release, and gentle cervical stretching.  Follow up with scapular retractions, cervical retractions, and median nerve glides.  Modalities as needed for pain modulation.       Evaluation complexity:   Personal factors impacting POC: FREQUENT OR CHRONIC PAIN and PRE-EXISTING FUNCTIONAL LIMITATIONS   Co-morbidities impacting POC: OBESITY, CARDIAC DISEASE, and PREVIOUS SURGERIES  Complexity of physical exam: INCLUDING MUSCULOSKELETAL SYSTEM (POSTURE, ROM, STRENGTH, HEIGHT/WEIGHT), INCLUDING NEUROMUSCULAR EXAM (BALANCE, GAIT, LOCOMOTION, MOBILITY), and INCLUDING ACTIVITY/MOBILITY RESTRICTIONS   Clinical Presentation: STABLE   Evaluation Complexity: LOW-HISTORY 0, EXAMINATION 1-2, STABLE PRESENTATION        Total Session Time 44, Timed code minutes 8, and Untimed code minutes 36        Intervention minutes: EVALUATION 36 minutes and THERAPEUTIC EXERCISE 8 minutes    Tymothy Cass, PT  01/15/2023, 10:13                Certification:    From:______  Through:_________    I certify the need for these services furnished under this plan of treatment and while under my care.    Referring Provider Signature: _______________     Date : _____________________

## 2023-01-17 ENCOUNTER — Ambulatory Visit (HOSPITAL_COMMUNITY)
Admission: RE | Admit: 2023-01-17 | Discharge: 2023-01-17 | Disposition: A | Discharge status: Still In Hospital | Payer: MEDICARE | Source: Ambulatory Visit

## 2023-01-17 ENCOUNTER — Other Ambulatory Visit: Payer: Self-pay

## 2023-01-17 DIAGNOSIS — M5412 Radiculopathy, cervical region: Secondary | ICD-10-CM

## 2023-01-17 NOTE — PT Treatment (Signed)
[facility]  Outpatient Physical Therapy  [address]  [place], [address]  [phone]  (Fax) [phone]    Physical Therapy Treatment Note    Date: 01/17/2023  Patient's Name: Selena Duke  Date of Birth: 1955-12-24  Physical Therapy Visit            Visit #/POC: 2 of up to 12 planned  Authorization: medical necessity  POC Signed?: no  POC Ends: 03/05/23  Order Ends: 02/24/23  Next Progress Note Due: by visit 8 or before 02/14/23      Evaluating Physical Therapist: Marcell Anger, PT, DPT  PT diagnosis/Reason for Referral: cervical pain, thoracic pain, degenerative disc changes  Next Scheduled Physician Appointment: TBD  Allergies/Contraindications: Latex    Subjective: Patient reports increased pain left shoulder today. Pain was 6/10 this morning but took anti-inflammatory and has decreased to 4/10.     Objective: Treatment delivered as outlined below:    Measured ROM: NT  EXERCISE/ACTIVITY NAME REPETITIONS RESISTANCE COMPLETED THIS DOS   Gentle cervical distraction     manual Yes    Suboccipital release     Manual  Yes    MFR scalenes/upper traps     manual Yes    Cervical retraction supine   10 x 2 sec   Yes    Gentle PROM cervical rotation       Yes    MFR left upper trap     Manual/roller ball Yes                            Assessment: decreased left arm symptoms noted during gentle cervical distraction. Large myofascial trigger point noted left upper trap.     Short-Term Goals: (3 Weeks):     - Patient will demonstrate improved cervical AROM to at least 70 degrees in sagittal plane to aid in completion of ADLs.     -  Patient will demonstrate independence with progressive HEP to maximize gains from PT.      - Patient will report max 6/10 pain to aid in completion of ADLs/work duties.           Long-Term Goals: (6 Weeks):     - Patient will demonstrate improved L UE strength of at least 4/5 to aid in completion of ADLs.     - Patient will demonstrate improved  cervical AROM to at least 80 degrees in the sagittal to aid in completion of ADLs.     -  Patient will demonstrate 10 overhead presses with 3# and good form to aid in completion of ADLs and IADLs.     -  Patient will report max 3/10 pain to aid in completion of ADLs/work duties.     -  Patient will demonstrate improved functional ability in daily life via improved Patient Specific Functional score of at least 6.     Plan: Assess tolerance to today's additions and progress as able. Consider [place] to left UT next session.     Total Session Time 39, Timed code minutes 39, and Untimed code minutes 0  THERAPEUTIC EXERCISE 10 minutes and JOINT MOBILIZATION/MFR 29 minutes      Shyna Duignan, PTA  01/17/2023, 09:35

## 2023-01-20 ENCOUNTER — Other Ambulatory Visit: Payer: Self-pay

## 2023-01-20 ENCOUNTER — Ambulatory Visit (HOSPITAL_COMMUNITY)
Admission: RE | Admit: 2023-01-20 | Discharge: 2023-01-20 | Disposition: A | Discharge status: Still In Hospital | Payer: MEDICARE | Source: Ambulatory Visit

## 2023-01-20 NOTE — PT Treatment (Signed)
[employer] [facility]  Outpatient Physical Therapy  [address]  [place], [address]  [phone]  (Fax) [phone]    Physical Therapy Treatment Note    Date: 01/20/2023  Patient's Name: Selena Duke  Date of Birth: 1955-03-12  Physical Therapy Visit            Visit #/POC: 3 of up to 12 planned  Authorization: medical necessity  POC Signed?: no  POC Ends: 03/05/23  Order Ends: 02/24/23  Next Progress Note Due: by visit 8 or before 02/14/23      Evaluating Physical Therapist: Marcell Anger, PT, DPT  PT diagnosis/Reason for Referral: cervical pain, thoracic pain, degenerative disc changes  Next Scheduled Physician Appointment: TBD  Allergies/Contraindications: Latex    Subjective: Reports she did a lot of decorating over the weekend and is having a lot of pain in left arm. She had some numbness/tingling in left arm over th weekend in her hand and fingers. She rates pain today 4-5/10 and describes it as "constant".    Objective: Treatment delivered as outlined below:    Measured ROM: NT  EXERCISE/ACTIVITY NAME REPETITIONS RESISTANCE COMPLETED THIS DOS   Gentle cervical distraction     manual Yes    Suboccipital release     Manual  Yes    MFR scalenes/upper traps     manual Yes    Cervical retraction supine   10 x 2 sec   Yes    Gentle PROM cervical rotation       Yes    MFR left upper trap     Manual Yes    [place] @ 1.2 w/cm2, 100%, 8 minutes  Left upper trap  Yes                      Assessment:  Good response to therapeutic ultrasound today. Continues with large spasm left upper trap. Good relief during manual cervical distraction. Pain had returned by end of session however.     Short-Term Goals: (3 Weeks):     - Patient will demonstrate improved cervical AROM to at least 70 degrees in sagittal plane to aid in completion of ADLs.     -  Patient will demonstrate independence with progressive HEP to maximize gains from PT.      - Patient will report max 6/10 pain to aid in  completion of ADLs/work duties.           Long-Term Goals: (6 Weeks):     - Patient will demonstrate improved L UE strength of at least 4/5 to aid in completion of ADLs.     - Patient will demonstrate improved cervical AROM to at least 80 degrees in the sagittal to aid in completion of ADLs.     -  Patient will demonstrate 10 overhead presses with 3# and good form to aid in completion of ADLs and IADLs.     -  Patient will report max 3/10 pain to aid in completion of ADLs/work duties.     -  Patient will demonstrate improved functional ability in daily life via improved Patient Specific Functional score of at least 6.     Plan: Assess tolerance to today's additions and progress as able.      Total Session Time 40, Timed code minutes 40, and Untimed code minutes 0  THERAPEUTIC EXERCISE 12 minutes,  ULTRASOUND , and JOINT MOBILIZATION/MFR 20 minutes      [employer], PTA 01/20/2023 09:34

## 2023-01-22 ENCOUNTER — Other Ambulatory Visit: Payer: Self-pay

## 2023-01-22 ENCOUNTER — Ambulatory Visit (HOSPITAL_COMMUNITY)
Admission: RE | Admit: 2023-01-22 | Discharge: 2023-01-22 | Disposition: A | Discharge status: Still In Hospital | Payer: MEDICARE | Source: Ambulatory Visit

## 2023-01-22 NOTE — PT Treatment (Addendum)
[facility]  Outpatient Physical Therapy  [address]  [place], [address]  [phone]  (Fax) [phone]    Physical Therapy Treatment Note    Date: 01/22/2023  Patient's Name: Selena Duke  Date of Birth: 21-May-1955  Physical Therapy Visit      Visit #/POC: 4 of up to 12 planned  Authorization: medical necessity  POC Signed?: no  POC Ends: 03/05/23  Order Ends: 02/24/23  Next Progress Note Due: by visit 8 or before 02/14/23        Evaluating Physical Therapist: Marcell Anger, PT, DPT  PT diagnosis/Reason for Referral: cervical pain, thoracic pain, degenerative disc changes  Next Scheduled Physician Appointment: TBD  Allergies/Contraindications: Latex     Subjective: Patient reports the symptoms in the neck and left arm are overall improving, but still sore.     Objective: Treatment delivered as outlined below:     Measured ROM: NT  EXERCISE/ACTIVITY NAME REPETITIONS RESISTANCE COMPLETED THIS DOS   Gentle cervical distraction      manual Yes    Suboccipital release      Manual  Yes    MFR scalenes/upper traps      manual Yes    Cervical retraction supine    10 x 2 sec   Yes    Gentle PROM cervical rotation        Yes    MFR left upper trap      Manual Yes    [place] @ 1.2 w/cm2, 100%, 8 minutes  Left upper trap   Yes     MHP 10 minutes Left upper trap Y                      Assessment:  Patient has increased tension down the left arm into the hand with all gentle cervical stretching and PROM.  Added MHP at the beginning of session to facilitate muscle relaxation and followed up with manual therapy to improve neural tension.  Overall, tolerated session fair without adverse effects.     Short-Term Goals: (3 Weeks):     - Patient will demonstrate improved cervical AROM to at least 70 degrees in sagittal plane to aid in completion of ADLs.     -  Patient will demonstrate independence with progressive HEP to maximize gains from PT.      - Patient will report max 6/10 pain to  aid in completion of ADLs/work duties.           Long-Term Goals: (6 Weeks):     - Patient will demonstrate improved L UE strength of at least 4/5 to aid in completion of ADLs.     - Patient will demonstrate improved cervical AROM to at least 80 degrees in the sagittal to aid in completion of ADLs.     -  Patient will demonstrate 10 overhead presses with 3# and good form to aid in completion of ADLs and IADLs.     -  Patient will report max 3/10 pain to aid in completion of ADLs/work duties.     -  Patient will demonstrate improved functional ability in daily life via improved Patient Specific Functional score of at least 6.      Plan: Assess tolerance to today's additions and progress as able.      Total Session Time 44, Timed code minutes 34, and Untimed code minutes 10   ULTRASOUND and JOINT MOBILIZATION/MFR 26 minutes, 10 minutes MHP  Jahmere Bramel, PT  01/22/2023, 11:04

## 2023-01-27 ENCOUNTER — Ambulatory Visit (HOSPITAL_COMMUNITY)
Admission: RE | Admit: 2023-01-27 | Discharge: 2023-01-27 | Disposition: A | Discharge status: Still In Hospital | Payer: MEDICARE | Source: Ambulatory Visit

## 2023-01-27 NOTE — PT Treatment (Signed)
[facility]  Outpatient Physical Therapy  [address]  [place], [address]  [phone]  (Fax) [phone]    Physical Therapy Treatment Note    Date: 01/27/2023  Patient's Name: Selena Duke  Date of Birth: 05/05/1955  Physical Therapy Visit      Visit #/POC: 5 of up to 12 planned  Authorization: medical necessity  POC Signed?: no  POC Ends: 03/05/23  Order Ends: 02/24/23  Next Progress Note Due: by visit 8 or before 02/14/23        Evaluating Physical Therapist: Marcell Anger, PT, DPT  PT diagnosis/Reason for Referral: cervical pain, thoracic pain, degenerative disc changes  Next Scheduled Physician Appointment: TBD  Allergies/Contraindications: Latex     Subjective: Patient reports she hung shower curtain yesterday which resulted in increased pain and numbness in left arm that extended into hand. She reports pain got up to about 8/10 last night and that she used ice pack. Today pain is about 5/10. She also notes she has a kidney stone that is "moving" and that she needs surgery for it but cannot get cardiac clearance.      Objective: Treatment delivered as outlined below:     Measured ROM: NT  EXERCISE/ACTIVITY NAME REPETITIONS RESISTANCE COMPLETED THIS DOS   Gentle cervical distraction      manual Yes    Suboccipital release      Manual  Yes    MFR scalenes/upper traps      manual Yes    Cervical retraction supine    10 x 2 sec   Yes    Gentle PROM cervical rotation        No   MFR left upper trap      Manual Yes    [place] @ 1.2 w/cm2, 100%, 8 minutes  Left upper trap   Yes     MHP 10 minutes Left upper trap Y                      Assessment:   Good response to MFR, heat, and ultrasound. Continues with tightness/spasm left upper trap.      Short-Term Goals: (3 Weeks):     - Patient will demonstrate improved cervical AROM to at least 70 degrees in sagittal plane to aid in completion of ADLs.     -  Patient will demonstrate independence with progressive HEP to maximize  gains from PT.      - Patient will report max 6/10 pain to aid in completion of ADLs/work duties.           Long-Term Goals: (6 Weeks):     - Patient will demonstrate improved L UE strength of at least 4/5 to aid in completion of ADLs.     - Patient will demonstrate improved cervical AROM to at least 80 degrees in the sagittal to aid in completion of ADLs.     -  Patient will demonstrate 10 overhead presses with 3# and good form to aid in completion of ADLs and IADLs.     -  Patient will report max 3/10 pain to aid in completion of ADLs/work duties.     -  Patient will demonstrate improved functional ability in daily life via improved Patient Specific Functional score of at least 6.      Plan: Assess tolerance to today's additions and progress as able.      Total Session Time 45, Timed code minutes 35, and Untimed  code minutes 10   ULTRASOUND and JOINT MOBILIZATION/MFR 27 minutes      [employer], PTA  01/27/2023 10:22

## 2023-01-29 ENCOUNTER — Ambulatory Visit
Admission: RE | Admit: 2023-01-29 | Discharge: 2023-01-29 | Disposition: A | Discharge status: Still In Hospital | Payer: MEDICARE | Source: Ambulatory Visit | Attending: Family | Admitting: Family

## 2023-01-29 NOTE — PT Treatment (Signed)
[facility]  Outpatient Physical Therapy  [address]  [place], [address]  [phone]  (Fax) [phone]    Physical Therapy Treatment Note    Date: 01/29/2023  Patient's Name: Selena Duke  Date of Birth: 10/23/1955  Physical Therapy Visit        Visit #/POC: 6 of up to 12 planned  Authorization: medical necessity  POC Signed?: no  POC Ends: 03/05/23  Order Ends: 02/24/23  Next Progress Note Due: by visit 8 or before 02/14/23        Evaluating Physical Therapist: Marcell Anger, PT, DPT  PT diagnosis/Reason for Referral: cervical pain, thoracic pain, degenerative disc changes  Next Scheduled Physician Appointment: TBD  Allergies/Contraindications: Latex     Subjective: Patient reports her neck and left arm symptoms are somewhat better in regards to intensity, but they are still there and are limiting her.   She relates hanging the shower curtain caused the entire right arm to go numb.  Symptoms at 5-6/10 after UBE warmup     Objective: Treatment delivered as outlined below:     Measured ROM: NT  EXERCISE/ACTIVITY NAME REPETITIONS RESISTANCE COMPLETED THIS DOS   UBE   5 minutes  Y   Gentle cervical distraction      manual Nh    Suboccipital release      Manual  N    MFR scalenes/upper traps      manual N    Cervical retraction supine    10 x 2 sec   N    Gentle PROM cervical rotation        No   MFR left upper trap      Manual N    [place] @ 1.2 w/cm2, 100%, 8 minutes  Left upper trap   N     MHP 10 minutes Left upper trap N             Wall slide  Arm circles at door   15  30"  Y  Y   T-band row  T-band low row 2x 20  2x20 Green  Red Y  Y   Seated ER in 70 degrees abduction  Seated shoulder press 15  15 1#  AROM Y  Y   Seated chin retraction  Seated cervical extension   15  15 AROM  AROM Y  Y (caused numbness into the thumb)   Seated cervical flexion  15 AROM Y (status quo)           Assessment:   Progressed to active based therex today with fair to poor response.   Peripheralization did occur during extension based cervical movements with increased numbness into the thumb.  Flexion based movements remained status quo.  Scapular strengthening did fatigue the arm and the patient tends to hold the LUE in a guarded and flexed elbow posturing position after exercises.    Short-Term Goals: (3 Weeks):     - Patient will demonstrate improved cervical AROM to at least 70 degrees in sagittal plane to aid in completion of ADLs.     -  Patient will demonstrate independence with progressive HEP to maximize gains from PT.      - Patient will report max 6/10 pain to aid in completion of ADLs/work duties.           Long-Term Goals: (6 Weeks):     - Patient will demonstrate improved L UE strength of at least 4/5 to aid in  completion of ADLs.     - Patient will demonstrate improved cervical AROM to at least 80 degrees in the sagittal to aid in completion of ADLs.     -  Patient will demonstrate 10 overhead presses with 3# and good form to aid in completion of ADLs and IADLs.     -  Patient will report max 3/10 pain to aid in completion of ADLs/work duties.     -  Patient will demonstrate improved functional ability in daily life via improved Patient Specific Functional score of at least 6.      Plan: Monitor response to progressed therex and continue based on response.    Total Session Time 35 and Timed code minutes 35  THERAPEUTIC EXERCISE 35 minutes      Jusitn Salsgiver, PT  01/29/2023, 08:46

## 2023-02-04 ENCOUNTER — Ambulatory Visit (HOSPITAL_COMMUNITY)
Admission: RE | Admit: 2023-02-04 | Discharge: 2023-02-04 | Disposition: A | Discharge status: Still In Hospital | Payer: MEDICARE | Source: Ambulatory Visit

## 2023-02-04 ENCOUNTER — Other Ambulatory Visit: Payer: Self-pay

## 2023-02-04 DIAGNOSIS — M546 Pain in thoracic spine: Secondary | ICD-10-CM | POA: Insufficient documentation

## 2023-02-04 DIAGNOSIS — Z8739 Personal history of other diseases of the musculoskeletal system and connective tissue: Secondary | ICD-10-CM | POA: Insufficient documentation

## 2023-02-04 DIAGNOSIS — M542 Cervicalgia: Secondary | ICD-10-CM | POA: Insufficient documentation

## 2023-02-04 NOTE — PT Treatment (Signed)
[facility]  Outpatient Physical Therapy  [address]  [place], [address]  [phone]  (Fax) [phone]    Physical Therapy Treatment Note    Date: 02/04/2023  Patient's Name: Selena Duke  Date of Birth: [birth year]  Physical Therapy Visit        Visit #/POC: 7 of up to 12 planned  Authorization: medical necessity  POC Signed?: no  POC Ends: 03/05/23  Order Ends: 02/24/23  Next Progress Note Due: by visit 8 or before 02/14/23        Evaluating Physical Therapist: Marcell Anger, PT, DPT  PT diagnosis/Reason for Referral: cervical pain, thoracic pain, degenerative disc changes  Next Scheduled Physician Appointment: TBD  Allergies/Contraindications: Latex     Subjective: 5 minute late arrival. Rates pain 3/10 at present, used an ice pack on the way over. She reports it took her "a little while" to recover from last session but did ok. Reports she has a lot of pain when she lies down.      Objective: Treatment delivered as outlined below:     Measured ROM: NT  EXERCISE/ACTIVITY NAME REPETITIONS RESISTANCE COMPLETED THIS DOS   UBE   5 minutes  Y   Gentle cervical distraction      manual Nh    Suboccipital release      Manual  N    MFR scalenes/upper traps      manual N    Cervical retraction supine    10 x 2 sec   N    Gentle PROM cervical rotation        No   MFR left upper trap      Manual N    [place] @ 1.2 w/cm2, 100%, 8 minutes  Left upper trap   N     MHP 10 minutes Left upper trap N             Wall slide  Arm circles at door   15  30"  Y  Y   T-band row  T-band low row 2x 20  2x20 Green  Red Y  Y   Seated ER in 70 degrees abduction  Seated shoulder press 15  15 1#  AROM Y  Y   Seated chin retraction  Seated cervical extension   15  15 AROM  AROM Y  Y (caused numbness into finger tips)   Seated cervical flexion  15 AROM Y (status quo)           Assessment:    Tolerated active exercise fair today. Does continue to report tingling in finger tips with active cervical  extension. Continues to report needing to use heat/ice frequently for pain control.     Short-Term Goals: (3 Weeks):     - Patient will demonstrate improved cervical AROM to at least 70 degrees in sagittal plane to aid in completion of ADLs.     -  Patient will demonstrate independence with progressive HEP to maximize gains from PT.      - Patient will report max 6/10 pain to aid in completion of ADLs/work duties.           Long-Term Goals: (6 Weeks):     - Patient will demonstrate improved L UE strength of at least 4/5 to aid in completion of ADLs.     - Patient will demonstrate improved cervical AROM to at least 80 degrees in the sagittal to aid in completion of ADLs.     -  Patient will demonstrate 10 overhead presses with 3# and good form to aid in completion of ADLs and IADLs.     -  Patient will report max 3/10 pain to aid in completion of ADLs/work duties.     -  Patient will demonstrate improved functional ability in daily life via improved Patient Specific Functional score of at least 6.      Plan: Continue progression of exercise as able.     Total Session Time 30 and Timed code minutes 30  THERAPEUTIC EXERCISE 30 minutes      [employer], PTA  02/04/2023 09:38

## 2023-02-07 ENCOUNTER — Ambulatory Visit (HOSPITAL_COMMUNITY): Payer: Self-pay

## 2023-02-11 ENCOUNTER — Ambulatory Visit (HOSPITAL_COMMUNITY)
Admission: RE | Admit: 2023-02-11 | Discharge: 2023-02-11 | Disposition: A | Discharge status: Still In Hospital | Payer: MEDICARE | Source: Ambulatory Visit

## 2023-02-11 ENCOUNTER — Other Ambulatory Visit: Payer: Self-pay

## 2023-02-11 NOTE — Progress Notes (Signed)
[facility]  Outpatient Physical Therapy  [address]  [place], [address]  [phone]  (Fax) [phone]    Physical Therapy Progress Note    Date: 02/11/2023  Patient's Name: Selena Duke  Date of Birth: [birth year]  Physical Therapy Progress Note        Visit #/POC: 8 of up to 16  Authorization: medical necessity  POC Signed?: no  POC Ends: 03/05/23  Order Ends: 02/24/23  Next Progress Note Due: 03/10/2022        Evaluating Physical Therapist: Marcell Anger, PT, DPT  PT diagnosis/Reason for Referral: cervical pain, thoracic pain, degenerative disc changes  Next Scheduled Physician Appointment: TBD  Allergies/Contraindications: Latex     Subjective: Patient reports 60% improvement with PT.  Patient relates she still cannot lay on either side because the entire left arm goes numb into the finger tips.  Patient feels the shoulder is significantly better in regards of mobility since the start of therapy which has allowed her to walk better without the arm feeling heavy.  Patient relates she would benefit from PT to address remaining deficits.  Worst pain 8/10 in left arm when carrying household items, but relates this is better since start.       Objective: Treatment delivered as outlined below:  Cervical AROM: 38 flexion (pain in the left shoulder and arm), 38 extension, 20 LSB, 30 RSB, 50 LROT, 44 RROT  Seated left shoulder AROM: 143 flexion, 150 abduction, 60 ER in neutral   JAMAR L: 32#  JAMAR R: 35#  Left shoulder MMT: 4-/5 throughout    Patient-Specific Functional Score:     Problem Score 02/11/23   1. Driving 1 1   2. House ADLs like vacuuming and mopping 1 5   3.       Total score = sum of the activity scores/number of activities    Minimal detectable change (90% CI) for avg score = 2 points    Minimal detectable change (90% CI) for single activity score = 3 points  1 3       EXERCISE/ACTIVITY NAME REPETITIONS RESISTANCE COMPLETED THIS DOS   UBE    5 minutes   Y    Gentle cervical distraction      manual N   Suboccipital release      Manual  N    MFR scalenes/upper traps      manual N    Cervical retraction supine    10 x 2 sec   N    Gentle PROM cervical rotation        No   MFR left upper trap      Manual N    [place] @ 1.2 w/cm2, 100%, 8 minutes  Left upper trap   N     MHP 10 minutes Left upper trap N             Wall slide  Arm circles at door    15  30"   Y  Y   T-band row  T-band low row 2x 20  2x20 Green  Red Y  Y   T-band shoulder adduction from 90 abduction (   20 Blue Y     T-band triceps extension  T-band biceps curl    20  20 Green  Green Y   Digiflex  10x3" hold Red Y   Seated ER in 70 degrees abduction  Seated shoulder press 15  15 1#  AROM Y  Y   Seated chin retraction  Seated cervical extension    15  15 AROM  AROM Y  Y (caused numbness into finger tips)   Seated cervical flexion  15 AROM Y (status quo)            Assessment:   Patient is meeting STGs and progressing toward LTGs at this time.  She exhibits improved cervical and left shoulder AROM per objective measurements.  Strength of the left shoulder also shows improvement.  Patient-specific functional scale has moderately improved with house ADLs but no change during driving.  She does require moderate cueing for form and sequencing to facilitate scapular depression and retraction vs guarded posturing.  Based on reassessment, I am recommending continued PT services to address remaining deficits and progress goals.  Interventions will continue to include therex, manual therapy, and modalities for the treatment of cervical pain.  She has fair potential to meet LTGs with continued PT.      Short-Term Goals: (3 Weeks):     - Patient will demonstrate improved cervical AROM to at least 70 degrees in sagittal plane to aid in completion of ADLs.  (Met 02/11/23)    -  Patient will demonstrate independence with progressive HEP to maximize gains from PT. (Met 02/11/23)     - Patient will report max 6/10 pain to aid in  completion of ADLs/work duties.  (Progressing 02/11/23)          Long-Term Goals: (6 Weeks):     - Patient will demonstrate improved L UE strength of at least 4/5 to aid in completion of ADLs.  (Progressing 02/11/23)    - Patient will demonstrate improved cervical AROM to at least 80 degrees in the sagittal to aid in completion of ADLs.  (Progressing 02/11/23)    -  Patient will demonstrate 10 overhead presses with 3# and good form to aid in completion of ADLs and IADLs.  (Progressing 02/11/23)    -  Patient will report max 3/10 pain to aid in completion of ADLs/work duties. (Not met 02/11/23)    -  Patient will demonstrate improved functional ability in daily life via improved Patient Specific Functional score of at least 6.      Plan: Continue current frequency 2x a week for 4 more weeks to address remaining deficits.  Monitor response to progressed therex.    Total Session Time 44 and Timed code minutes 44  THERAPEUTIC EXERCISE 44 minutes      Teoman Giraud, PT  02/11/2023, 09:36

## 2023-02-13 ENCOUNTER — Ambulatory Visit (HOSPITAL_COMMUNITY)
Admission: RE | Admit: 2023-02-13 | Discharge: 2023-02-13 | Disposition: A | Discharge status: Still In Hospital | Payer: MEDICARE | Source: Ambulatory Visit

## 2023-02-13 ENCOUNTER — Other Ambulatory Visit: Payer: Self-pay

## 2023-02-13 NOTE — PT Treatment (Signed)
[facility] Medicine [facility]  Outpatient Physical Therapy  [address]  [place], [address]  [phone]  (Fax) [phone]    Physical Therapy Treatment Note    Date: 02/13/2023  Patient's Name: Selena Duke  Date of Birth: 1955-11-04  Physical Therapy Visit        Visit #/POC: 10 of up to 12 planned  Authorization: medical necessity  POC Signed?: no  POC Ends: 03/05/23  Order Ends: 02/24/23  Next Progress Note Due: by visit 8 or before 02/14/23        Evaluating Physical Therapist: Marcell Anger, PT, DPT  PT diagnosis/Reason for Referral: cervical pain, thoracic pain, degenerative disc changes  Next Scheduled Physician Appointment: TBD  Allergies/Contraindications: Latex     Subjective: Patient reports continued numbness with overhead activities. She is in no pain at rest today. She was able to make fudge yesterday.      Objective: Treatment delivered as outlined below:     Measured ROM: NT       EXERCISE/ACTIVITY NAME REPETITIONS RESISTANCE COMPLETED THIS DOS   UBE    6 minutes   Y   Gentle cervical distraction      manual N   Suboccipital release      Manual  N    MFR scalenes/upper traps      manual N    Cervical retraction supine    10 x 2 sec   N    Gentle PROM cervical rotation        No   MFR left upper trap      Manual N    [place] @ 1.2 w/cm2, 100%, 8 minutes  Left upper trap   N     MHP 10 minutes Left upper trap N             Wall slide  Arm circles at door    15  30"   Y  Y   T-band row  T-band low row 2x 15  2x15 Green  Green Y  Y   T-band shoulder adduction from 90 abduction (    20 Blue N      T-band triceps extension  T-band biceps curl     20  20 Green  Chales Salmon   Digiflex  15x3" hold Red Y   Seated ER in 70 degrees abduction  Seated shoulder press 15  15 1#  AROM Y  Y   Seated chin retraction  Seated cervical extension    15  15 AROM  AROM Y  Y (pain into upper trap)   Seated cervical flexion  15 AROM Y (status quo)            Assessment:   Patient tolerated session well.  Responding well to increased exercises. She reports overall intensity of pain is much better. She does continue to have numbness and tingling into arm with overhead activities. At end of session she reported no pain "just hot" feeling in arm.     Short-Term Goals: (3 Weeks):     - Patient will demonstrate improved cervical AROM to at least 70 degrees in sagittal plane to aid in completion of ADLs.     -  Patient will demonstrate independence with progressive HEP to maximize gains from PT.      - Patient will report max 6/10 pain to aid in completion of ADLs/work duties.           Long-Term Goals: (6 Weeks):     -  Patient will demonstrate improved L UE strength of at least 4/5 to aid in completion of ADLs.     - Patient will demonstrate improved cervical AROM to at least 80 degrees in the sagittal to aid in completion of ADLs.     -  Patient will demonstrate 10 overhead presses with 3# and good form to aid in completion of ADLs and IADLs.     -  Patient will report max 3/10 pain to aid in completion of ADLs/work duties.     -  Patient will demonstrate improved functional ability in daily life via improved Patient Specific Functional score of at least 6.      Plan: Continue progression of exercise as able.     Total Session Time 36 and Timed code minutes 36  THERAPEUTIC EXERCISE 36 minutes      Chirag Krueger, PTA  02/13/2023 10:19

## 2023-02-18 ENCOUNTER — Other Ambulatory Visit: Payer: Self-pay

## 2023-02-18 ENCOUNTER — Ambulatory Visit (HOSPITAL_COMMUNITY)
Admission: RE | Admit: 2023-02-18 | Discharge: 2023-02-18 | Disposition: A | Discharge status: Still In Hospital | Payer: MEDICARE | Source: Ambulatory Visit

## 2023-02-18 NOTE — PT Treatment (Signed)
[facility] Medicine [facility]  Outpatient Physical Therapy  [address]  [place], [address]  [phone]  (Fax) [phone]    Physical Therapy Treatment Note    Date: 02/18/2023  Patient's Name: Selena Duke  Date of Birth: 1956-02-07  Physical Therapy Visit        Visit #/POC: 11 of up to 16 planned  Authorization: medical necessity  POC Signed?: no  POC Ends: 03/05/23  Order Ends: 02/24/23  Next Progress Note Due: by visit 8 or before 02/14/23        Evaluating Physical Therapist: Marcell Anger, PT, DPT  PT diagnosis/Reason for Referral: cervical pain, thoracic pain, degenerative disc changes  Next Scheduled Physician Appointment: TBD  Allergies/Contraindications: Latex     Subjective: patient reports she was able to sleep on left side for the first time in several months. She anticipates she is almost ready to be done with PT.       Objective: Treatment delivered as outlined below:     Measured ROM: NT    JAMAR L: 44#  JAMAR R: 41#  EXERCISE/ACTIVITY NAME REPETITIONS RESISTANCE COMPLETED THIS DOS   UBE    6 minutes   Y   Gentle cervical distraction      manual N   Suboccipital release      Manual  N    MFR scalenes/upper traps      manual N    Cervical retraction supine    10 x 2 sec   N    Gentle PROM cervical rotation        No   MFR left upper trap      Manual N    [place] @ 1.2 w/cm2, 100%, 8 minutes  Left upper trap   N     MHP 10 minutes Left upper trap N             Wall slide  Arm circles at door    15  30"   Y  Y   T-band row  T-band low row 2x 15  2x15 Green  Green Y  Y   T-band shoulder adduction from 90 abduction (    20 Blue N      T-band triceps extension  T-band biceps curl     20  20 Green  3# dumbbell Y  Y   Digiflex  30x2" hold Red Y   Seated ER in 70 degrees abduction  Seated shoulder press 15  15 1#  AROM Y  Y   Seated chin retraction  Seated cervical extension    15  15 AROM  AROM Y  Y (pain into upper trap)   Seated cervical flexion  15 AROM Y (status quo)             Assessment:  Patient's grip is now nearly symmetrical. Bilateral grip strength has improved. Overall pain level is much improved although numbness/tingling persists.     Short-Term Goals: (3 Weeks):     - Patient will demonstrate improved cervical AROM to at least 70 degrees in sagittal plane to aid in completion of ADLs.     -  Patient will demonstrate independence with progressive HEP to maximize gains from PT.      - Patient will report max 6/10 pain to aid in completion of ADLs/work duties.           Long-Term Goals: (6 Weeks):     - Patient will demonstrate improved L UE  strength of at least 4/5 to aid in completion of ADLs.     - Patient will demonstrate improved cervical AROM to at least 80 degrees in the sagittal to aid in completion of ADLs.     -  Patient will demonstrate 10 overhead presses with 3# and good form to aid in completion of ADLs and IADLs.     -  Patient will report max 3/10 pain to aid in completion of ADLs/work duties.     -  Patient will demonstrate improved functional ability in daily life via improved Patient Specific Functional score of at least 6.      Plan: Continue progression of exercise as able.     Total Session Time 40 and Timed code minutes 40  THERAPEUTIC EXERCISE 40 minutes      [employer], PTA  02/18/2023 09:31

## 2023-02-20 ENCOUNTER — Ambulatory Visit (HOSPITAL_COMMUNITY): Payer: Self-pay

## 2023-02-20 ENCOUNTER — Other Ambulatory Visit: Payer: Self-pay

## 2023-02-20 ENCOUNTER — Ambulatory Visit
Admission: RE | Admit: 2023-02-20 | Discharge: 2023-02-20 | Disposition: A | Discharge status: Still In Hospital | Payer: MEDICARE | Source: Ambulatory Visit | Attending: Family | Admitting: Family

## 2023-02-20 DIAGNOSIS — M5412 Radiculopathy, cervical region: Secondary | ICD-10-CM

## 2023-02-20 NOTE — PT Treatment (Signed)
[facility] Medicine [facility]  Outpatient Physical Therapy  [address]  [place], [address]  [phone]  (Fax) [phone]    Physical Therapy DIscharge Note    Date: 02/20/2023  Patient's Name: Selena Duke  Date of Birth: April 09, 1955  Physical Therapy Discharge          Visit #/POC: 12 of up to 16 planned  Authorization: medical necessity  POC Signed?: no  POC Ends: 03/05/23  Order Ends: 02/24/23  Next Progress Note Due: 03/11/2023        Evaluating Physical Therapist: Marcell Anger, PT, DPT  PT diagnosis/Reason for Referral: cervical pain, thoracic pain, degenerative disc changes  Next Scheduled Physician Appointment: January 2025  Allergies/Contraindications: Latex     Subjective: Patient reports she is stronger in the left shoulder and has had reduced pain allowing her to perform functional ADLs better, but still experiencings paresthesias down the left arm into the entire hand that remains unchanged.     Objective: Treatment delivered as outlined below:  Cervical AROM: 31 flexion, 25 extension, 14 LSB, 33 RSB, 45 LROT, 50 RROT     Measured ROM: 140 flexion, 105 abduction, 66 ER in neutral, and IR to L1    JAMAR L: 44#  JAMAR R: 41#  EXERCISE/ACTIVITY NAME REPETITIONS RESISTANCE COMPLETED THIS DOS   UBE    6 minutes   Y   Gentle cervical distraction      manual N   Suboccipital release      Manual  N    MFR scalenes/upper traps      manual N    Cervical retraction supine    10 x 2 sec   N    Gentle PROM cervical rotation        No   MFR left upper trap      Manual N    [place] @ 1.2 w/cm2, 100%, 8 minutes  Left upper trap   N     MHP 10 minutes Left upper trap N             Wall slide  Arm circles at door    15  30"   Y  Y   T-band row  T-band low row 2x 15  2x15 Green  Green Y  Y   T-band shoulder adduction from 90 abduction (    20 Blue N      T-band triceps extension  T-band biceps curl     20  20 Green  3# dumbbell Y  Y   Digiflex  30x2" hold Red Y   Seated ER in 70 degrees  abduction  Seated shoulder press 15  15 1#  AROM Y  Y   Seated chin retraction  Seated cervical extension    15  15 AROM  AROM Y  Y (pain into upper trap)   Seated cervical flexion  15 AROM Y (status quo)            Access Code: [email or web address]  URL: [email or web address]  Date: 02/20/2023  Prepared by: Trinna Post Kinga Cassar    Exercises  - Standing Row with Anchored Resistance  - 1 x daily - 3 x weekly - 2 sets - 15 reps  - Shoulder extension with resistance - Neutral  - 1 x daily - 3 x weekly - 2 sets - 15 reps  - Seated Passive Cervical Retraction  - 1 x daily - 3 x weekly - 2 sets - 12 reps - 3 second  hold  - Seated Upper Trapezius Stretch  - 1 x daily - 3 x weekly - 1 sets - 2 reps - 30 second hold  - Median Nerve Flossing - Tray  - 1 x daily - 3 x weekly - 1 sets - 15 reps    Assessment:  Patient has met strength and pain goals of therapy, but still experiences paresthesias down the entire LUE affecting all fingers.  She is now able to sleep on the left shoulder which she hasn't been able to do in the past and reports her grip strength is much improved when opening bottles and cooking.  Based on reassessment, I am recommending discharge to HEP at this time as she is independent with exercises.  I am also recommending follow up with referring physician to further assess paresthesias down the LUE.  Interventions have included therex, manual therapy, and modalities for the treatment of cervical pain.     Short-Term Goals: (3 Weeks):     - Patient will demonstrate improved cervical AROM to at least 70 degrees in sagittal plane to aid in completion of ADLs.  (Not met 02/20/23)    -  Patient will demonstrate independence with progressive HEP to maximize gains from PT. (Met 02/20/23)     - Patient will report max 6/10 pain to aid in completion of ADLs/work duties. (Met 02/20/23)          Long-Term Goals: (6 Weeks):     - Patient will demonstrate improved L UE strength of at least 4/5 to aid in completion of ADLs.  (Met  02/20/23)    - Patient will demonstrate improved cervical AROM to at least 80 degrees in the sagittal to aid in completion of ADLs. (Not met 02/20/23)    -  Patient will demonstrate 10 overhead presses with 3# and good form to aid in completion of ADLs and IADLs. (Not met 02/20/23)    -  Patient will report max 3/10 pain to aid in completion of ADLs/work duties.  (Met 02/20/23)    -  Patient will demonstrate improved functional ability in daily life via improved Patient Specific Functional score of at least 6.  (Not Met 02/20/23)     Plan: Discharge to HEP.    Total Session Time 27 and Timed code minutes 27  THERAPEUTIC EXERCISE 27 minutes      [name], PT  02/20/2023, 08:00

## 2023-06-03 ENCOUNTER — Ambulatory Visit (HOSPITAL_COMMUNITY): Payer: Self-pay | Admitting: SLEEP MEDICINE

## 2023-07-22 ENCOUNTER — Encounter (INDEPENDENT_AMBULATORY_CARE_PROVIDER_SITE_OTHER): Payer: Self-pay

## 2023-12-03 ENCOUNTER — Encounter (HOSPITAL_COMMUNITY): Payer: Self-pay

## 2023-12-04 ENCOUNTER — Other Ambulatory Visit (HOSPITAL_COMMUNITY): Payer: Self-pay

## 2023-12-04 DIAGNOSIS — R079 Chest pain, unspecified: Secondary | ICD-10-CM

## 2023-12-04 DIAGNOSIS — I429 Cardiomyopathy, unspecified: Secondary | ICD-10-CM

## 2023-12-31 ENCOUNTER — Other Ambulatory Visit: Payer: Self-pay

## 2023-12-31 ENCOUNTER — Ambulatory Visit (HOSPITAL_COMMUNITY): Payer: MEDICARE

## 2023-12-31 ENCOUNTER — Ambulatory Visit
Admission: RE | Admit: 2023-12-31 | Discharge: 2023-12-31 | Disposition: A | Discharge status: Home / Self Care | Payer: MEDICARE | Source: Ambulatory Visit

## 2023-12-31 DIAGNOSIS — I429 Cardiomyopathy, unspecified: Secondary | ICD-10-CM

## 2023-12-31 DIAGNOSIS — R079 Chest pain, unspecified: Secondary | ICD-10-CM | POA: Insufficient documentation

## 2023-12-31 LAB — CTA HEART CORONARY
Biplane Simpson EF: 11 %
CT Ascending aorta: 3.5 cm
CT Sinus: 3.3 cm
LCX (AGATSTON SCORE): 0
LVIDS 2D: 0 cm
RCA (AGATSTON SCORE): 0
Total Agatston: 11

## 2023-12-31 LAB — CREATININE WITH EGFR
CREATININE: 0.65 mg/dL (ref 0.60–1.30)
ESTIMATED GFR: 96 mL/min/1.73mˆ2 (ref >59)

## 2023-12-31 MED ORDER — IOPAMIDOL 370 MG IODINE/ML (76 %) INTRAVENOUS SOLUTION
85.0000 mL | INTRAVENOUS | Status: AC
Start: 2023-12-31 — End: 2023-12-31

## 2023-12-31 MED ORDER — NITROGLYCERIN 0.4 MG SUBLINGUAL TABLET
SUBLINGUAL_TABLET | SUBLINGUAL | Status: AC
Start: 2023-12-31 — End: 2023-12-31
  Filled 2023-12-31: qty 1

## 2023-12-31 MED ORDER — NITROGLYCERIN 0.4 MG SUBLINGUAL TABLET
0.4000 mg | SUBLINGUAL_TABLET | SUBLINGUAL | Status: AC
Start: 2023-12-31 — End: 2023-12-31

## 2023-12-31 NOTE — Nurses Notes (Addendum)
 Patient arrived for Coronary CTA.  Educated on exam, contrast, and medication use.  All questions answered, Patient encouraged to increase oral fluid intake, verbalized understanding.      Arrival VS BP 130/78, HR 57.  Will follow protocol    HR maintains in mid 50's upon arrival.    Patient on CT table  for exam, medicated with Nitroglycerin 0.4 mg SL x 1.    BP 117/75 HR 62    Tolerated exam without difficulty    Post orthostatic VS obtained:    Lying:  BP 113/72, HR 63  Sitting:  BP 124/71, HR 59  Standing:  BP 120/73, HR 62    Able to ambulate without difficulty.  Discharge instructions discussed, verbalized understanding.

## 2024-01-13 NOTE — H&P (View-Only) (Signed)
 GENERAL SURGERY, [facility]  [address]  [place] [address]    History and Physical    Name: Selena Duke MRN:  [id number]   Date: 01/14/2024 DOB:  05/19/1955 (68 y.o.)                  Reason for Visit: Esophageal Reflux    History of Present Illness  Ms. Vinje presents today initially for evaluation of ongoing atypical chest pain.  Patient has tightness in her chest which seems to be slightly more prominent with oral intake.  She has had a negative cardiac workup for this at this time.  Has longstanding gastroesophageal reflux disease.  Currently takes Reglan and Prilosec secondary to this.  Does require intermittent antiemetics as well.  No dysphagia.  No unexplained weight loss.    In addition, it has been 12 years since her last colonoscopy.  Currently has no colorectal symptoms.  Has no family history of colon cancer.  Last colonoscopy showed sigmoid diverticulosis and internal hemorrhoids.        MEDICAL DECISION:  Review of prior external note(s) from each unique source:  Patients referral to this office including a recent assessment by the referring provider.  This was reviewed by me for this unique office visit for the indication and intent of the referral as well as any pertinent medical or surgical history relevant to the patients independent evaluation by me today.        Patient Data  Patient History  Past Medical History:   Diagnosis Date    Esophageal reflux     HLD (hyperlipidemia)     Hypertension     Interstitial cystitis     Renal calculi          Past Surgical History:   Procedure Laterality Date    HX ADENOIDECTOMY      HX APPENDECTOMY      HX CHOLECYSTECTOMY      HX HYSTERECTOMY      HX TONSILLECTOMY           Current Outpatient Medications   Medication Sig    amLODIPine (NORVASC) 5 mg Oral Tablet Take 1 Tablet (5 mg total) by mouth Once a day    carvediloL (COREG) 12.5 mg Oral Tablet Take 1 Tablet (12.5 mg total) by mouth Twice daily with food     hydroCHLOROthiazide (HYDRODIURIL) 12.5 mg Oral Tablet Take 1 Tablet (12.5 mg total) by mouth Once a day    HYDROcodone -acetaminophen  (NORCO) 5-325 mg Oral Tablet Take 1 Tablet by mouth Every 6 hours as needed for Pain (Patient not taking: Reported on 01/14/2024)    latanoprost (XALATAN) 0.005 % Ophthalmic Drops Instill 1 Drop into both eyes Every evening    losartan (COZAAR) 100 mg Oral Tablet Take 1 Tablet (100 mg total) by mouth Once a day    metoclopramide HCl (REGLAN) 10 mg Oral Tablet Take 1 Tablet (10 mg total) by mouth Twice daily    omeprazole (PRILOSEC) 40 mg Oral Capsule, Delayed Release(E.C.) Take 1 Capsule (40 mg total) by mouth Once a day    ondansetron  (ZOFRAN  ODT) 4 mg Oral Tablet, Rapid Dissolve Take 1 Tablet (4 mg total) by mouth Every 8 hours as needed for Nausea/Vomiting    PEG 3350 -Electrolytes 236-22.74-6.74 -5.86 gram Oral Recon Soln Take 4,000 mL by mouth One time for 1 dose    rizatriptan (MAXALT-MLT) 10 mg Oral Tablet, Rapid Dissolve Take 1 Tablet (10 mg total) by mouth  Once, as needed for Migraine May repeat in 2 hours if needed.    tamsulosin  (FLOMAX ) 0.4 mg Oral Capsule Take 1 Capsule (0.4 mg total) by mouth Every evening after dinner (Patient not taking: Reported on 01/14/2024)    tamsulosin  (FLOMAX ) 0.4 mg Oral Capsule Take 1 Capsule (0.4 mg total) by mouth Every evening after dinner for 7 days (Patient not taking: Reported on 01/14/2024)     Allergies[1]  Family Medical History:    None         Social History[2]         Physical Examination:  Vitals:    01/14/24 0834   BP: 113/68   Pulse: 67   SpO2: 93%   Weight: 99.8 kg (220 lb)      General: appropriate for age. in no acute distress.    Vital signs are present above and have been reviewed by me     HEENT: Atraumatic, Normocephalic.    Lungs: Nonlabored breathing with symmetric expansion    Heart:Regular wth respect to rate.    Abdomen:Soft. Nontender. Nondistended     Psychiatric: Alert and oriented to person, place, and time. affect  appropriate      Assessment and Plan    ICD-10-CM    1. Atypical chest pain  R07.89       2. Encounter for screening colonoscopy  Z12.11             Discussed indications, risks and benefits of esophagogastroduodenoscopy and colonoscopy with the patient.  Discussed the possibility of polypectomy, biopsies, and possible repeat examinations.  Risks include bleeding, sedation risks, possibility of missed diagnosis of polyp or malignancy, and remote possibilities of perforation and death.  All questions were answered, and informed consent was clearly obtained.           I appreciate the opportunity to be involved in the care of your patients.  If you have any questions or concerns regarding this encounter, please do not hesitate to contact me at your convenience.      Alm DELENA Nam MD MBA CPE FACS     This note may have been partially generated using MModal Fluency Direct system, and there may be some incorrect words, spellings, and punctuation that were not noted in checking the note before saving, though effort was made to avoid such errors.                 [1]   Allergies  Allergen Reactions    Latex  Other Adverse Reaction (Add comment)   [2]   Social History  Tobacco Use    Smoking status: Never    Smokeless tobacco: Never   Vaping Use    Vaping status: Never Used   Substance Use Topics    Alcohol use: Never    Drug use: Never

## 2024-01-13 NOTE — H&P (Signed)
 GENERAL SURGERY, [facility]  [address]  [place] [address]    History and Physical    Name: EPIFANIA LITTRELL MRN:  [id number]   Date: 01/14/2024 DOB:  05/19/1955 (68 y.o.)                  Reason for Visit: Esophageal Reflux    History of Present Illness  Ms. Vinje presents today initially for evaluation of ongoing atypical chest pain.  Patient has tightness in her chest which seems to be slightly more prominent with oral intake.  She has had a negative cardiac workup for this at this time.  Has longstanding gastroesophageal reflux disease.  Currently takes Reglan and Prilosec secondary to this.  Does require intermittent antiemetics as well.  No dysphagia.  No unexplained weight loss.    In addition, it has been 12 years since her last colonoscopy.  Currently has no colorectal symptoms.  Has no family history of colon cancer.  Last colonoscopy showed sigmoid diverticulosis and internal hemorrhoids.        MEDICAL DECISION:  Review of prior external note(s) from each unique source:  Patients referral to this office including a recent assessment by the referring provider.  This was reviewed by me for this unique office visit for the indication and intent of the referral as well as any pertinent medical or surgical history relevant to the patients independent evaluation by me today.        Patient Data  Patient History  Past Medical History:   Diagnosis Date    Esophageal reflux     HLD (hyperlipidemia)     Hypertension     Interstitial cystitis     Renal calculi          Past Surgical History:   Procedure Laterality Date    HX ADENOIDECTOMY      HX APPENDECTOMY      HX CHOLECYSTECTOMY      HX HYSTERECTOMY      HX TONSILLECTOMY           Current Outpatient Medications   Medication Sig    amLODIPine (NORVASC) 5 mg Oral Tablet Take 1 Tablet (5 mg total) by mouth Once a day    carvediloL (COREG) 12.5 mg Oral Tablet Take 1 Tablet (12.5 mg total) by mouth Twice daily with food     hydroCHLOROthiazide (HYDRODIURIL) 12.5 mg Oral Tablet Take 1 Tablet (12.5 mg total) by mouth Once a day    HYDROcodone -acetaminophen  (NORCO) 5-325 mg Oral Tablet Take 1 Tablet by mouth Every 6 hours as needed for Pain (Patient not taking: Reported on 01/14/2024)    latanoprost (XALATAN) 0.005 % Ophthalmic Drops Instill 1 Drop into both eyes Every evening    losartan (COZAAR) 100 mg Oral Tablet Take 1 Tablet (100 mg total) by mouth Once a day    metoclopramide HCl (REGLAN) 10 mg Oral Tablet Take 1 Tablet (10 mg total) by mouth Twice daily    omeprazole (PRILOSEC) 40 mg Oral Capsule, Delayed Release(E.C.) Take 1 Capsule (40 mg total) by mouth Once a day    ondansetron  (ZOFRAN  ODT) 4 mg Oral Tablet, Rapid Dissolve Take 1 Tablet (4 mg total) by mouth Every 8 hours as needed for Nausea/Vomiting    PEG 3350 -Electrolytes 236-22.74-6.74 -5.86 gram Oral Recon Soln Take 4,000 mL by mouth One time for 1 dose    rizatriptan (MAXALT-MLT) 10 mg Oral Tablet, Rapid Dissolve Take 1 Tablet (10 mg total) by mouth  Once, as needed for Migraine May repeat in 2 hours if needed.    tamsulosin  (FLOMAX ) 0.4 mg Oral Capsule Take 1 Capsule (0.4 mg total) by mouth Every evening after dinner (Patient not taking: Reported on 01/14/2024)    tamsulosin  (FLOMAX ) 0.4 mg Oral Capsule Take 1 Capsule (0.4 mg total) by mouth Every evening after dinner for 7 days (Patient not taking: Reported on 01/14/2024)     Allergies[1]  Family Medical History:    None         Social History[2]         Physical Examination:  Vitals:    01/14/24 0834   BP: 113/68   Pulse: 67   SpO2: 93%   Weight: 99.8 kg (220 lb)      General: appropriate for age. in no acute distress.    Vital signs are present above and have been reviewed by me     HEENT: Atraumatic, Normocephalic.    Lungs: Nonlabored breathing with symmetric expansion    Heart:Regular wth respect to rate.    Abdomen:Soft. Nontender. Nondistended     Psychiatric: Alert and oriented to person, place, and time. affect  appropriate      Assessment and Plan    ICD-10-CM    1. Atypical chest pain  R07.89       2. Encounter for screening colonoscopy  Z12.11             Discussed indications, risks and benefits of esophagogastroduodenoscopy and colonoscopy with the patient.  Discussed the possibility of polypectomy, biopsies, and possible repeat examinations.  Risks include bleeding, sedation risks, possibility of missed diagnosis of polyp or malignancy, and remote possibilities of perforation and death.  All questions were answered, and informed consent was clearly obtained.           I appreciate the opportunity to be involved in the care of your patients.  If you have any questions or concerns regarding this encounter, please do not hesitate to contact me at your convenience.      Alm DELENA Nam MD MBA CPE FACS     This note may have been partially generated using MModal Fluency Direct system, and there may be some incorrect words, spellings, and punctuation that were not noted in checking the note before saving, though effort was made to avoid such errors.                 [1]   Allergies  Allergen Reactions    Latex  Other Adverse Reaction (Add comment)   [2]   Social History  Tobacco Use    Smoking status: Never    Smokeless tobacco: Never   Vaping Use    Vaping status: Never Used   Substance Use Topics    Alcohol use: Never    Drug use: Never

## 2024-01-14 ENCOUNTER — Ambulatory Visit (INDEPENDENT_AMBULATORY_CARE_PROVIDER_SITE_OTHER): Payer: Self-pay | Admitting: Surgery

## 2024-01-14 ENCOUNTER — Encounter (INDEPENDENT_AMBULATORY_CARE_PROVIDER_SITE_OTHER): Payer: Self-pay | Admitting: Surgery

## 2024-01-14 ENCOUNTER — Other Ambulatory Visit: Payer: Self-pay

## 2024-01-14 VITALS — BP 113/68 | HR 67 | Wt 220.0 lb

## 2024-01-14 DIAGNOSIS — Z1211 Encounter for screening for malignant neoplasm of colon: Secondary | ICD-10-CM

## 2024-01-14 DIAGNOSIS — R0789 Other chest pain: Secondary | ICD-10-CM

## 2024-01-14 DIAGNOSIS — Z01818 Encounter for other preprocedural examination: Secondary | ICD-10-CM

## 2024-01-14 DIAGNOSIS — K219 Gastro-esophageal reflux disease without esophagitis: Secondary | ICD-10-CM

## 2024-01-14 DIAGNOSIS — Z8719 Personal history of other diseases of the digestive system: Secondary | ICD-10-CM

## 2024-01-14 MED ORDER — PEG 3350-ELECTROLYTES 236 GRAM-22.74 GRAM-6.74 GRAM-5.86 GRAM SOLUTION: 4.0000 L | Freq: Once | ORAL | 0 refills | Status: DC

## 2024-02-12 ENCOUNTER — Ambulatory Visit (HOSPITAL_COMMUNITY): Payer: MEDICARE | Admitting: Surgery

## 2024-02-12 ENCOUNTER — Encounter (HOSPITAL_COMMUNITY): Payer: Self-pay | Admitting: Surgery

## 2024-02-12 ENCOUNTER — Ambulatory Visit
Admission: RE | Admit: 2024-02-12 | Discharge: 2024-02-12 | Disposition: A | Discharge status: Home / Self Care | Payer: MEDICARE | Source: Ambulatory Visit | Attending: Surgery | Admitting: Surgery

## 2024-02-12 ENCOUNTER — Ambulatory Visit (HOSPITAL_COMMUNITY): Payer: MEDICARE

## 2024-02-12 ENCOUNTER — Encounter (HOSPITAL_COMMUNITY)
Admission: RE | Disposition: A | Discharge status: Home / Self Care | Payer: Self-pay | Source: Ambulatory Visit | Attending: Surgery

## 2024-02-12 ENCOUNTER — Other Ambulatory Visit: Payer: Self-pay

## 2024-02-12 DIAGNOSIS — Z79899 Other long term (current) drug therapy: Secondary | ICD-10-CM | POA: Insufficient documentation

## 2024-02-12 DIAGNOSIS — K648 Other hemorrhoids: Secondary | ICD-10-CM | POA: Insufficient documentation

## 2024-02-12 DIAGNOSIS — K2940 Chronic atrophic gastritis without bleeding: Secondary | ICD-10-CM | POA: Insufficient documentation

## 2024-02-12 DIAGNOSIS — K5730 Diverticulosis of large intestine without perforation or abscess without bleeding: Secondary | ICD-10-CM | POA: Insufficient documentation

## 2024-02-12 DIAGNOSIS — K259 Gastric ulcer, unspecified as acute or chronic, without hemorrhage or perforation: Secondary | ICD-10-CM | POA: Insufficient documentation

## 2024-02-12 DIAGNOSIS — R0789 Other chest pain: Secondary | ICD-10-CM | POA: Insufficient documentation

## 2024-02-12 DIAGNOSIS — K449 Diaphragmatic hernia without obstruction or gangrene: Secondary | ICD-10-CM | POA: Insufficient documentation

## 2024-02-12 DIAGNOSIS — K219 Gastro-esophageal reflux disease without esophagitis: Secondary | ICD-10-CM | POA: Insufficient documentation

## 2024-02-12 DIAGNOSIS — Z1211 Encounter for screening for malignant neoplasm of colon: Secondary | ICD-10-CM | POA: Insufficient documentation

## 2024-02-12 SURGERY — GASTROSCOPY WITH BIOPSY
Anesthesia: General | Wound class: Clean Contaminated Wounds-The respiratory, GI, Genital, or urinary

## 2024-02-12 MED ORDER — PROPOFOL 10 MG/ML IV BOLUS
INJECTION | Freq: Once | INTRAVENOUS | Status: DC | PRN
  Administered 2024-02-12: 80 mg via INTRAVENOUS
  Administered 2024-02-12: 20 mg via INTRAVENOUS
  Administered 2024-02-12: 50 mg via INTRAVENOUS
  Administered 2024-02-12: 30 mg via INTRAVENOUS
  Administered 2024-02-12 (×3): 20 mg via INTRAVENOUS

## 2024-02-12 MED ORDER — LACTATED RINGERS INTRAVENOUS SOLUTION: INTRAVENOUS | Status: DC | PRN

## 2024-02-12 MED ORDER — LIDOCAINE (PF) 100 MG/5 ML (2 %) INTRAVENOUS SYRINGE
INJECTION | Freq: Once | INTRAVENOUS | Status: DC | PRN
  Administered 2024-02-12: 80 mg via INTRAVENOUS

## 2024-02-12 MED ORDER — FENTANYL (PF) 50 MCG/ML INJECTION WRAPPER
INJECTION | Freq: Once | INTRAMUSCULAR | Status: DC | PRN
  Administered 2024-02-12: 50 ug via INTRAVENOUS

## 2024-02-12 MED ORDER — SUCRALFATE 1 GRAM TABLET: 1.0000 g | ORAL_TABLET | Freq: Two times a day (BID) | ORAL | 2 refills | Status: AC

## 2024-02-12 SURGICAL SUPPLY — 2 items
CLEANER INSTRUMENT PRE-KLENZ 13.5 OZ (MISCELLANEOUS PT CARE ITEMS) ×1 IMPLANT
FORCEPS BIOPSY LRG CPC NEEDLE 240CM 2.2MM RJ 3 DISP ORNG (ENDOSCOPIC SUPPLIES) IMPLANT

## 2024-02-12 NOTE — OR Surgeon (Signed)
[facility]      Patient Name: Selena Duke, Selena Duke  Hospital Number: [id number]  Date of Service: 02/12/2024   Date of Birth: 11/13/1955      Pre-Operative Diagnosis: Atypical chest pain [R07.89]  Encounter for screening colonoscopy [Z12.11]     Post-Operative Diagnosis: superficial antral ulcer  Gastrits  3cm hiatal hernia  sigmoid diverticulosis  internal hemorrhoids    Procedure(s)/Description:  EGD WITH BIOPSY: 43239 (CPT)    COLONOSCOPY: 54621 (CPT)       Attending Surgeon: Alm Nam, MD     Anesthesia:  CRNA: Lani Clark, APRN,CRNA    Anesthesia Type: .General     Specimens Removed:   ID Type Source Tests Collected by Time Destination   1 : antrum bx x1 Tissue Antrum SURGICAL PATHOLOGY SPECIMEN Nam Alm, MD 02/12/2024 [phone]           Patient was taken to the endoscopy suite and given appropriate intravenous sedation.  Video gastroscope inserted in the posterior pharynx injected distally into the esophagus.  Esophagus transverse the stomach was entered without difficulty.  Stomach was insufflated with air.  Scope was advanced to the level of the pylorus.  The pylorus cannulated.  First and 2nd portions of the duodenum visualized with no evidence of any abnormality.  Scope was withdrawn back into the distal stomach.  There was 1 superficial ulceration just proximal to the pylorus.  No signs of recent or active bleeding.  Single biopsy taken for H pylori at that level.  Scope was then withdrawn back.  The body and fundus showed no specific abnormality.  The scope was retroflexed.  The GE junction visualized from below showing a 3 cm hiatal hernia and no other abnormality.  Scope was then withdrawn back through the remaining course of the esophageal lumen.  No other abnormality noted through the course of withdrawal.  No significant signs of esophagitis or reflux that could explain the patient's current chest pain symptoms.  Patient was turned.  Videocolonoscope was inserted  into the rectum and advanced sequentially to the level of the cecum.  Cecum was confirmed by external palpation, presence of the ileocecal valve and transillumination of the light.  Picture was taken that documents this level.  Colonoscope was then subsequently withdrawn back inspecting all mucosal surfaces.  The ascending colon, transverse colon, and descending colon were all visualized with no specific abnormality.  The colonoscope was withdrawn back to the level of the sigmoid colon noting changes of diverticulosis at that level.  No other specific abnormality was noted.  The scope was withdrawn back to the level of the rectum and retroflexed.  The rectal anal junction visualized with internal hemorrhoids and no other specific abnormality.  Scope was then subsequently straightened and withdrawn.  This concluded the procedure which patient tolerated well.  Patient would not need another routine colonoscopy for 10 years barring any change in symptoms and/or presentation.    Alm DELENA Nam MD MBA CPE FACS

## 2024-02-12 NOTE — Interval H&P Note (Signed)
[facility]      H&P UPDATE FORM                                                                                  Selena Duke, Selena Duke, 68 y.o. female  Date of Admission:  02/12/2024  Date of Birth:  05/03/1955    02/12/2024    STOP: IF H&P IS GREATER THAN 30 DAYS FROM SURGICAL DAY COMPLETE NEW H&P IS REQUIRED.     H & P updated the day of the procedure.  1.  H&P completed within 30 days of surgical procedure and has been reviewed within 24 hours of admission but prior to surgery or a procedure requiring anesthesia services, the patient has been examined, and no change has occured in the patients condition since the H&P was completed.       Change in medications: No        No LMP recorded.      Comments:     2.  Patient continues to be appropriate candidate for planned surgical procedure. YES    Selena Nam, MD

## 2024-02-12 NOTE — Anesthesia Postprocedure Evaluation (Signed)
 Anesthesia Post Op Evaluation    Patient: Selena Duke  Procedure(s):  EGD WITH BIOPSY  COLONOSCOPY    Last Vitals:Temperature: 36.3 C (97.3 F) (02/12/24 0829)  Heart Rate: 74 (02/12/24 0829)  BP (Non-Invasive): 139/81 (02/12/24 0829)  Respiratory Rate: 18 (02/12/24 0829)  SpO2: 96 % (02/12/24 0829)    No notable events documented.    Patient is sufficiently recovered from the effects of anesthesia to participate in the evaluation and has returned to their pre-procedure level.  Patient location during evaluation: PACU       Patient participation: complete - patient participated  Level of consciousness: awake and alert and responsive to verbal stimuli    Pain management: adequate  Airway patency: patent    Anesthetic complications: no  Cardiovascular status: acceptable  Respiratory status: acceptable  Hydration status: acceptable  Patient post-procedure temperature: Pt Normothermic   PONV Status: Absent

## 2024-02-12 NOTE — Anesthesia Preprocedure Evaluation (Addendum)
 ANESTHESIA PRE-OP EVALUATION  Planned Procedure: EGD  COLONOSCOPY  Review of Systems         patient summary reviewed  nursing notes reviewed        Pulmonary   sleep apnea and CPAP,   Cardiovascular    Hypertension, well controlled, ECG reviewed and hyperlipidemia ,No peripheral edema,  Exercise Tolerance: > or = 4 METS   ,beta blocker therapy  ,taken in last 24 hours     GI/Hepatic/Renal    GERD, well controlled and kidney stones        Endo/Other         Neuro/Psych/MS        Cancer                        Physical Assessment      Airway       Mallampati: III    TM distance: <3 FB    Neck ROM: full  Mouth Opening: fair.            Dental           (+) upper dentures           Pulmonary    Breath sounds clear to auscultation  (-) no rhonchi, no decreased breath sounds, no wheezes, no rales and no stridor     Cardiovascular    Rhythm: regular  Rate: Normal  (-) no friction rub, carotid bruit is not present, no peripheral edema and no murmur     Other findings              Plan  ASA 2     Planned anesthesia type: general     total intravenous anesthesia            SLEEP APNEA  Patient is at risk of obstructive sleep apnea        Intravenous induction     Anesthesia issues/risks discussed are: PONV, Stroke, Intraoperative Awareness/ Recall and Cardiac Events/MI.  Anesthetic plan and risks discussed with patient  signed consent obtained      Use of blood products discussed with patient who consented to blood products.      Patient's NPO status is appropriate for Anesthesia.

## 2024-02-12 NOTE — Discharge Instructions (Addendum)
 SURGICAL DISCHARGE INSTRUCTIONS     Dr. Steen Lenis, MD  performed your EGD WITH BIOPSY, COLONOSCOPY today at the [facility]    [place]  [facility]:  Monday through Friday from 8 a.m. - 4 p.m.: [phone]    For T&D: [phone]  Between 4 p.m. - 8 a.m., weekends and holidays:  Call ER [phone]    PLEASE SEE WRITTEN HANDOUTS AS DISCUSSED BY YOUR NURSE:  GASTRIC ULCERS, GASTRITIS, HIATAL HERNIA, DIVERTICULOSIS, HEMORRHOIDS HIGH FIBER DIET,SUCRALFATE  TABLETS        ANESTHESIA INFORMATION   ANESTHESIA -- ADULT PATIENTS:  You have received intravenous sedation / general anesthesia, and you may feel drowsy and light-headed for several hours. You may even experience some forgetfulness of the procedure. DO NOT DRIVE A MOTOR VEHICLE or perform any activity requiring complete alertness or coordination until you feel fully awake in about 24-48 hours. Do not drink alcoholic beverages for at least 24 hours. Do not stay alone, you must have a responsible adult available to be with you. You may also experience a dry mouth or nausea for 24 hours. This is a normal side effect and will disappear as the effects of the medication wear off.     REST TODAY.  DO NOT DRIVE, COOK, OPERATE MACHINERY OR SIGN LEGAL DOCUMENTS.    RESUME NORMAL ACTIVITIES TOMORROW.      REMEMBER   If you experience any difficulty breathing, chest pain, bleeding that you feel is excessive, persistent nausea or vomiting or for any other concerns:  Call your physician Dr.  Steen Lenis, MD   at [phone] . You may also ask to have the general doctor on call paged. They are available to you 24 hours a day.      SPECIAL INSTRUCTIONS / COMMENTS   YOUR PROCEDURE TODAY SHOWED:  superficial antral ulcer,   gastrits,   3cm hiatal hernia  sigmoid diverticulosis,   internal hemorrhoids     FOLLOW-UP APPOINTMENTS   Please call your surgeon's office at the number listed to schedule a date / time of return for follow-up.     Dr  Lenis Steen [phone]

## 2024-02-12 NOTE — Anesthesia Transfer of Care (Signed)
 ANESTHESIA TRANSFER OF CARE   JOHNETTE TEIGEN is a 68 y.o. ,female, Weight: 102 kg (225 lb)   had Procedure(s):  EGD WITH BIOPSY  COLONOSCOPY  performed  02/12/2024   Primary Service: Alm Nam, MD    Past Medical History:   Diagnosis Date    Esophageal reflux     HLD (hyperlipidemia)     Hypertension     Interstitial cystitis     Renal calculi       Allergy History as of 02/12/24       LATEX         Noted Status Severity Type Reaction    09/21/22 1608 Betti Males, RN 09/21/22 Active    Other Adverse Reaction (Add comment)                  I completed my transfer of care / handoff to the receiving personnel during which we discussed:  Access, Airway, All key/critical aspects of case discussed, Analgesia, Antibiotics, Expectation of post procedure, Fluids/Product, Gave opportunity for questions and acknowledgement of understanding, Labs and PMHx                                                                   Last OR Temp: Temperature: 36.3 C (97.3 F)      Airway:* No LDAs found *  Blood pressure 139/81, pulse 74, temperature 36.3 C (97.3 F), resp. rate 18, height 1.549 m (5' 1), weight 102 kg (225 lb), SpO2 96%.

## 2024-02-13 DIAGNOSIS — K2950 Unspecified chronic gastritis without bleeding: Secondary | ICD-10-CM

## 2024-02-13 LAB — SURGICAL PATHOLOGY SPECIMEN
